# Patient Record
Sex: Female | Born: 2000 | Race: White | Hispanic: Yes | Smoking: Never smoker
Health system: Southern US, Community
[De-identification: ages and names within clinical notes are randomized; demographics above are authoritative.]

## PROBLEM LIST (undated history)

## (undated) ENCOUNTER — Inpatient Hospital Stay (HOSPITAL_COMMUNITY): Payer: Self-pay

## (undated) DIAGNOSIS — K219 Gastro-esophageal reflux disease without esophagitis: Secondary | ICD-10-CM

## (undated) DIAGNOSIS — Z789 Other specified health status: Secondary | ICD-10-CM

## (undated) HISTORY — DX: Other specified health status: Z78.9

## (undated) HISTORY — PX: NO PAST SURGERIES: SHX2092

---
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
## 2022-04-01 ENCOUNTER — Other Ambulatory Visit: Payer: Self-pay

## 2022-04-01 ENCOUNTER — Emergency Department (HOSPITAL_COMMUNITY)
Admission: EM | Admit: 2022-04-01 | Discharge: 2022-04-02 | Disposition: A | Discharge status: Home / Self Care | Payer: Medicaid Other | Attending: Emergency Medicine | Admitting: Emergency Medicine

## 2022-04-01 DIAGNOSIS — R1013 Epigastric pain: Secondary | ICD-10-CM | POA: Diagnosis present

## 2022-04-01 DIAGNOSIS — K2100 Gastro-esophageal reflux disease with esophagitis, without bleeding: Secondary | ICD-10-CM | POA: Insufficient documentation

## 2022-04-01 NOTE — ED Triage Notes (Signed)
Pt c/o upper abdominal pain that she describes as burning. Has been nausea without vomiting, decreased po intake,  burning with urination, and feeling lightheaded.

## 2022-04-02 ENCOUNTER — Emergency Department (HOSPITAL_COMMUNITY): Payer: Medicaid Other

## 2022-04-02 LAB — COMPREHENSIVE METABOLIC PANEL
ALT: 13 U/L (ref 0–44)
AST: 11 U/L — ABNORMAL LOW (ref 15–41)
Albumin: 4.2 g/dL (ref 3.5–5.0)
Alkaline Phosphatase: 74 U/L (ref 38–126)
Anion gap: 9 (ref 5–15)
BUN: 10 mg/dL (ref 6–20)
CO2: 19 mmol/L — ABNORMAL LOW (ref 22–32)
Calcium: 9 mg/dL (ref 8.9–10.3)
Chloride: 109 mmol/L (ref 98–111)
Creatinine, Ser: 0.63 mg/dL (ref 0.44–1.00)
GFR, Estimated: >60 mL/min (ref >60)
Glucose, Bld: 92 mg/dL (ref 70–99)
Potassium: 3.5 mmol/L (ref 3.5–5.1)
Sodium: 137 mmol/L (ref 135–145)
Total Bilirubin: 0.4 mg/dL (ref 0.3–1.2)
Total Protein: 7.6 g/dL (ref 6.5–8.1)

## 2022-04-02 LAB — CBC WITH DIFFERENTIAL/PLATELET
Abs Immature Granulocytes: 0.04 10*3/uL (ref 0.00–0.07)
Basophils Absolute: 0.1 10*3/uL (ref 0.0–0.1)
Basophils Relative: 1 %
Eosinophils Absolute: 0.3 10*3/uL (ref 0.0–0.5)
Eosinophils Relative: 3 %
HCT: 38.4 % (ref 36.0–46.0)
Hemoglobin: 12.9 g/dL (ref 12.0–15.0)
Immature Granulocytes: 0 %
Lymphocytes Relative: 23 %
Lymphs Abs: 2.6 10*3/uL (ref 0.7–4.0)
MCH: 29.2 pg (ref 26.0–34.0)
MCHC: 33.6 g/dL (ref 30.0–36.0)
MCV: 86.9 fL (ref 80.0–100.0)
Monocytes Absolute: 0.7 10*3/uL (ref 0.1–1.0)
Monocytes Relative: 7 %
Neutro Abs: 7.4 10*3/uL (ref 1.7–7.7)
Neutrophils Relative %: 66 %
Platelets: 292 10*3/uL (ref 150–400)
RBC: 4.42 MIL/uL (ref 3.87–5.11)
RDW: 13.2 % (ref 11.5–15.5)
WBC: 11.1 10*3/uL — ABNORMAL HIGH (ref 4.0–10.5)
nRBC: 0 % (ref 0.0–0.2)

## 2022-04-02 LAB — URINALYSIS, ROUTINE W REFLEX MICROSCOPIC
Bacteria, UA: NONE SEEN
Bilirubin Urine: NEGATIVE
Glucose, UA: NEGATIVE mg/dL
Hgb urine dipstick: NEGATIVE
Ketones, ur: 5 mg/dL — AB
Leukocytes,Ua: NEGATIVE
Nitrite: NEGATIVE
Protein, ur: NEGATIVE mg/dL
Specific Gravity, Urine: 1.026 (ref 1.005–1.030)
pH: 6 (ref 5.0–8.0)

## 2022-04-02 LAB — PREGNANCY, URINE: Preg Test, Ur: NEGATIVE

## 2022-04-02 LAB — LIPASE, BLOOD: Lipase: 28 U/L (ref 11–51)

## 2022-04-02 MED ORDER — MAALOX MAX 400-400-40 MG/5ML PO SUSP: 15.0000 mL | Freq: Four times a day (QID) | ORAL | 0 refills | Status: DC | PRN

## 2022-04-02 MED ORDER — OMEPRAZOLE 20 MG PO CPDR: 20.0000 mg | DELAYED_RELEASE_CAPSULE | Freq: Every day | ORAL | 0 refills | Status: DC

## 2022-04-02 MED ORDER — FAMOTIDINE 20 MG PO TABS: 20.0000 mg | ORAL_TABLET | Freq: Two times a day (BID) | ORAL | 0 refills | Status: DC

## 2022-04-02 MED ORDER — ONDANSETRON 4 MG PO TBDP
4.0000 mg | ORAL_TABLET | Freq: Once | ORAL | Status: AC
  Administered 2022-04-02: 4 mg via ORAL
  Filled 2022-04-02: qty 1

## 2022-04-02 NOTE — ED Provider Triage Note (Signed)
Emergency Medicine Provider Triage Evaluation Note  Jodi Mckenzie , a 21 y.o. female  was evaluated in triage.  Pt complains of epigastric pain going on for last couple days, was started on Flagyl due to BV, pain is worsened after p.o. intake, states she feels nausea without active vomiting had normal bowel movements no abdominal surgery no chest pain no shortness of breath ever had this in the past..  Review of Systems  Positive: Epigastric pain, nausea Negative: Chest pain shortness of breath  Physical Exam  BP 126/88 (BP Location: Right Arm)   Pulse 92   Temp 99.4 F (37.4 C)   Resp 18   Ht '5\' 5"'$  (1.651 m)   Wt 60.3 kg   SpO2 100%   BMI 22.13 kg/m  Gen:   Awake, no distress   Resp:  Normal effort  MSK:   Moves extremities without difficulty  Other:    Medical Decision Making  Medically screening exam initiated at 12:00 AM.  Appropriate orders placed.  Jodi Mckenzie was informed that the remainder of the evaluation will be completed by another provider, this initial triage assessment does not replace that evaluation, and the importance of remaining in the ED until their evaluation is complete.  Lab work imaging ordered will need further work-up.   Marcello Fennel, PA-C 04/02/22 0001

## 2022-04-02 NOTE — ED Provider Notes (Signed)
[facility] EMERGENCY DEPARTMENT Provider Note   CSN: [id number] Arrival date & time: 04/01/22  2254     History Chief Complaint  Patient presents with   Abdominal Pain    HPI Jodi Mckenzie is a 21 y.o. female presenting for epigastric abdominal pain.  She endorses 5 days of progressive epigastric burning with developing nausea and poor p.o. intake yesterday.  She presented last night to the emergency department and was unfortunately held for 10 hours in the waiting room.  She denies fevers or chills, syncope or shortness of breath.  She is able to tolerate p.o. intake overnight.  Family history of severe esophagitis no personal history of medical problems.  She states that during her prolonged wait in the emergency department, most of her symptoms have now resolved.   Patient's recorded medical, surgical, social, medication list and allergies were reviewed in the Snapshot window as part of the initial history.   Review of Systems   Review of Systems  Constitutional:  Negative for chills and fever.  HENT:  Negative for ear pain and sore throat.   Eyes:  Negative for pain and visual disturbance.  Respiratory:  Negative for cough and shortness of breath.   Cardiovascular:  Negative for chest pain and palpitations.  Gastrointestinal:  Positive for abdominal pain. Negative for vomiting.  Genitourinary:  Negative for dysuria and hematuria.  Musculoskeletal:  Negative for arthralgias and back pain.  Skin:  Negative for color change and rash.  Neurological:  Negative for seizures and syncope.  All other systems reviewed and are negative.   Physical Exam Updated Vital Signs BP 128/80 (BP Location: Left Arm)   Pulse 98   Temp 99.6 F (37.6 C)   Resp 16   Ht '5\' 5"'$  (1.651 m)   Wt 60.3 kg   SpO2 97%   BMI 22.13 kg/m  Physical Exam Vitals and nursing note reviewed.  Constitutional:      General: She is not in acute distress.    Appearance: She is  well-developed.  HENT:     Head: Normocephalic and atraumatic.  Eyes:     Conjunctiva/sclera: Conjunctivae normal.  Cardiovascular:     Rate and Rhythm: Normal rate and regular rhythm.     Heart sounds: No murmur heard. Pulmonary:     Effort: Pulmonary effort is normal. No respiratory distress.     Breath sounds: Normal breath sounds.  Abdominal:     Palpations: Abdomen is soft.     Tenderness: There is abdominal tenderness in the epigastric area. There is no right CVA tenderness or left CVA tenderness.  Musculoskeletal:        General: No swelling.     Cervical back: Neck supple.  Skin:    General: Skin is warm and dry.     Capillary Refill: Capillary refill takes less than 2 seconds.  Neurological:     Mental Status: She is alert.  Psychiatric:        Mood and Affect: Mood normal.      ED Course/ Medical Decision Making/ A&P    Procedures Procedures   Medications Ordered in ED Medications  ondansetron (ZOFRAN-ODT) disintegrating tablet 4 mg (4 mg Oral Given 04/02/22 0800)    Medical Decision Making:    Syretta Kochel is a 21 y.o. female who presented to the ED today with epigastric abdominal pain detailed above.     Patient placed on continuous vitals and telemetry monitoring while in ED which was reviewed periodically.  Complete initial physical exam performed, notably the patient  was hemodynamically stable in no acute distress.  Patient ambulatory tolerating p.o. intake..      Reviewed and confirmed nursing documentation for past medical history, family history, social history.    Initial Assessment:   With the patient's presentation of abdominal pain, most likely diagnosis is GERD. Other diagnoses were considered including (but not limited to) gastroenteritis, colitis, small bowel obstruction, appendicitis, cholecystitis, pancreatitis, nephrolithiasis, UTI, pyleonephritis, ruptured ectopic pregnancy, PID, ovarian torsion. These are considered less likely due  to history of present illness and physical exam findings.   This is most consistent with an acute life/limb threatening illness complicated by underlying chronic conditions.   Initial Plan:  CBC/CMP to evaluate for underlying infectious/metabolic etiology for patient's abdominal pain  Lipase to evaluate for pancreatitis  RUQ [place] to evaluate for structural/surgical etiology of patients'  abdominal pain.  Urinalysis and repeat physical assessment to evaluate for UTI/Pyelonpehritis  Empiric management of symptoms with escalating pain control and antiemetics as needed.   Initial Study Results:   Laboratory  All laboratory results reviewed without evidence of clinically relevant pathology.    Radiology All images reviewed independently. Agree with radiology report at this time.   US Abdomen Limited  Result Date: 04/02/2022 CLINICAL DATA:  Right upper quadrant abdominal pain EXAM: ULTRASOUND ABDOMEN LIMITED RIGHT UPPER QUADRANT COMPARISON:  None Available. FINDINGS: Gallbladder: No gallstones or wall thickening visualized. No sonographic Murphy sign noted by sonographer. Common bile duct: Diameter: 2 mm in proximal Diane Liver: No focal lesion identified. Within normal limits in parenchymal echogenicity. Portal vein is patent on color Doppler imaging with normal direction of blood flow towards the liver. Other: None. IMPRESSION: 1. Normal right upper quadrant sonogram. Electronically Signed   By: Fidela Salisbury M.D.   On: 04/02/2022 00:45     Final Reassessment and Plan:   Patient observed in the emergency department for a total of 10 hours.  Called back from triage overnight for reassessment this morning.  Fortunately her symptoms have grossly improved.  She has some nonspecific abnormalities including a slight white blood cell count elevation and borderline fever.  She attributes this to being treated for bacterial vaginosis actively at this time on Flagyl.  Her exam is grossly benign fortunately.   Objective findings are reassuring otherwise.  Given her hemodynamic stability, well appearance, protracted observation in the emergency department, I do not believe there is any acute indication for any further intervention with his reported.  Patient's history present on his physical exam findings are most consistent with developing complex esophagitis and GERD.  Given her description of symptoms last night, will start patient on omeprazole 20 mg daily, Pepcid 20 mg as needed, Maalox as needed.  Strict return precaution reinforced importance of following with PCP reinforced the patient expressed understanding. Disposition:  I have considered need for hospitalization, however, considering all of the above, I believe this patient is stable for discharge at this time.  Patient/family educated about specific return precautions for given chief complaint and symptoms.  Patient/family educated about follow-up with PCP.     Patient/family expressed understanding of return precautions and need for follow-up. Patient spoken to regarding all imaging and laboratory results and appropriate follow up for these results. All education provided in verbal form with additional information in written form. Time was allowed for answering of patient questions. Patient discharged.    Emergency Department Medication Summary:   Medications  ondansetron (ZOFRAN-ODT) disintegrating tablet 4 mg (4  mg Oral Given 04/02/22 0800)         Clinical Impression:  1. Gastroesophageal reflux disease with esophagitis without hemorrhage      Discharge   Final Clinical Impression(s) / ED Diagnoses Final diagnoses:  Gastroesophageal reflux disease with esophagitis without hemorrhage    Rx / DC Orders ED Discharge Orders          Ordered    omeprazole (PRILOSEC) 20 MG capsule  Daily        04/02/22 0852    alum & mag hydroxide-simeth (MAALOX MAX) 400-400-40 MG/5ML suspension  Every 6 hours PRN        04/02/22 0852     famotidine (PEPCID) 20 MG tablet  2 times daily        04/02/22 3790              Tretha Sciara, MD 04/02/22 [phone]

## 2022-05-18 NOTE — L&D Delivery Note (Signed)
Delivery Note: Bali Viele [id number] at 100w0d  Admitting diagnosis: Normal labor [O80, Z37.9]   I was called urgently to the room due to the desire to push and was informed at that time the patient was in the tub.  CNM Gerrit Heck has discussed with the RN that if normal BP then it was OK to get into the tub but documentation states the CNM would return for consent.  The patient was in the tub when I arrive and was pushing. There was not sufficient water in the tub for the patient's desired position (hands and knees). She was in a reclined position as this did allow submersion of the vaginal opening.  She was pushing and infant was crowning. I provided verbal instructions and support. Patient pushed once for delivery of the head. There was difficulty due to the position and there was a nuchal present. By this time the tub was filled enough to allow her to transition to kneeling/hands and knees and shoulder delivered in this position. The infant was kept under the water level the entire time.  It only came to the surface to be placed on the mother's chest.   Second Stage: Complete dilation at 04/29/2023 12:45 Supported by FOB- [place] and Doula Katie FHR monitored intermittently and was appropriate throughout pushing.  Pushing in variable positions with waterbirth provider and LD support staff present for birth and supportive. Nuchal Cord: Yes - delivered through and somersaulted under the water  Delivery of a Live born female  Birth Weight:   APGAR:4,8,   Newborn Delivery   Birth date/time: 04/29/2023 13:01:00 Delivery type:     Infant delivered in cephalic presentation, in OA position and shoulders delivered easily. Infant was grasped by  waterbirth provider Federico Flake).  Sommeraulted under the water to alleviate the nuchal cord. Placed on birth parent chest. No birth table was set up. The infant was stimulated and had low initial low APGAR and no instruments were  set up to detach infant. I used bulb suction and provided active feedback to the RN team about the infant's HR and tone.   When instruments were available, cord was double clamped by Federico Flake, MD MD, cut by [facility] of cord blood for typing completed. Cord blood donation-None Arterial cord blood sample-No   Third Stage: Patient stood out of the water and with gentle traction placenta delivered into awaiting specimen tub. Placenta delivered Spontaneous with 3 vessels. Uterine tone firm, bleeding Minimal Uterotonics: Not indicated Placenta to Home with family in cooler they provided  2nd degree laceration identified.  Episiotomy:None Local analgesia: Lidocaine  Repair: 2nd Est. Blood Loss (mL):22.00  Complications: None   Mom to postpartum.  Baby Alex to [facility] care / Skin to Skin.  Delivery Report:   Review the Delivery Report for details.     Signed: Federico Flake, MD  04/29/2023, 5:10 PM

## 2022-05-23 ENCOUNTER — Encounter (HOSPITAL_COMMUNITY): Payer: Self-pay

## 2022-05-23 ENCOUNTER — Ambulatory Visit (HOSPITAL_COMMUNITY)
Admission: EM | Admit: 2022-05-23 | Discharge: 2022-05-23 | Disposition: A | Discharge status: Home / Self Care | Payer: Medicaid Other | Attending: Emergency Medicine | Admitting: Emergency Medicine

## 2022-05-23 DIAGNOSIS — J069 Acute upper respiratory infection, unspecified: Secondary | ICD-10-CM

## 2022-05-23 DIAGNOSIS — H109 Unspecified conjunctivitis: Secondary | ICD-10-CM | POA: Diagnosis not present

## 2022-05-23 LAB — POCT RAPID STREP A, ED / UC: Streptococcus, Group A Screen (Direct): NEGATIVE

## 2022-05-23 MED ORDER — POLYMYXIN B-TRIMETHOPRIM 10000-0.1 UNIT/ML-% OP SOLN: 1.0000 [drp] | OPHTHALMIC | 0 refills | Status: DC

## 2022-05-23 MED ORDER — LIDOCAINE VISCOUS HCL 2 % MT SOLN: 15.0000 mL | OROMUCOSAL | 0 refills | Status: DC | PRN

## 2022-05-23 NOTE — Discharge Instructions (Addendum)
Today you being treated for bacterial conjunctivitis.   Place one drop of polytrim into the effected eye every 4 hours while awake for 7 days. If the other eye starts to have symptoms you may use medication in it as well. Do not allow tip of dropper to touch eye.  May use cool compress for comfort and to remove discharge if present. Pat the eye, do not wipe.  Do not rub eyes, this may cause more irritation.  May use benadryl as needed to help if itching present.  Please avoid use of eye makeup until symptoms clear.  If symptoms persist after use of medication, please follow up at Urgent Care or with ophthalmologist (eye doctor)   Strep negative   May gargle and spit lidocaine solution every 4 hours to provide a temporary relief to your throat  You can take Tylenol and/or Ibuprofen as needed for fever reduction and pain relief.   For cough: honey 1/2 to 1 teaspoon (you can dilute the honey in water or another fluid).  You can also use guaifenesin and dextromethorphan for cough. You can use a humidifier for chest congestion and cough.  If you don't have a humidifier, you can sit in the bathroom with the hot shower running.      For sore throat: try warm salt water gargles, cepacol lozenges, throat spray, warm tea or water with lemon/honey, popsicles or ice, or OTC cold relief medicine for throat discomfort.   For congestion: take a daily anti-histamine like Zyrtec, Claritin, and a oral decongestant, such as pseudoephedrine.  You can also use Flonase 1-2 sprays in each nostril daily.   It is important to stay hydrated: drink plenty of fluids (water, gatorade/powerade/pedialyte, juices, or teas) to keep your throat moisturized and help further relieve irritation/discomfort.

## 2022-05-23 NOTE — ED Triage Notes (Signed)
Pt reports one week of sore throat with cough. Pt reports she has pink eye.
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
## 2022-09-08 ENCOUNTER — Ambulatory Visit (INDEPENDENT_AMBULATORY_CARE_PROVIDER_SITE_OTHER): Payer: Medicaid Other | Admitting: *Deleted

## 2022-09-08 VITALS — BP 108/74 | HR 76 | Ht 65.0 in | Wt 120.3 lb

## 2022-09-08 DIAGNOSIS — Z3481 Encounter for supervision of other normal pregnancy, first trimester: Secondary | ICD-10-CM

## 2022-09-08 DIAGNOSIS — Z3201 Encounter for pregnancy test, result positive: Secondary | ICD-10-CM

## 2022-09-08 LAB — POCT URINE PREGNANCY: Preg Test, Ur: POSITIVE — AB

## 2022-09-08 MED ORDER — VITAFOL ULTRA 29-0.6-0.4-200 MG PO CAPS: 1.0000 | ORAL_CAPSULE | Freq: Every day | ORAL | 12 refills | Status: DC

## 2022-09-08 NOTE — Progress Notes (Signed)
Ms. Prospero presents today for UPT. She has no unusual complaints. LMP:    OBJECTIVE: Appears well, in no apparent distress.  OB History     Gravida  1   Para  1   Term  1   Preterm      AB      Living  1      SAB      IAB      Ectopic      Multiple  0   Live Births  1          Home UPT Result: Positive In-Office UPT result: Positive I have reviewed the patient's medical, obstetrical, social, and family histories, and medications.   ASSESSMENT: Positive pregnancy test  PLAN Prenatal care to be completed at: [facility]  RX PNV sent. Advised on Unisom/ Vit B6 recommendations for nausea, denies current N/V.SAB precautions given. OB Intake/ [place] appt scheduled at check out.

## 2022-09-14 ENCOUNTER — Inpatient Hospital Stay (HOSPITAL_COMMUNITY)
Admission: AD | Admit: 2022-09-14 | Discharge: 2022-09-14 | Disposition: A | Discharge status: Home / Self Care | Payer: Medicaid Other | Attending: Family Medicine | Admitting: Family Medicine

## 2022-09-14 ENCOUNTER — Inpatient Hospital Stay (HOSPITAL_COMMUNITY): Payer: Medicaid Other

## 2022-09-14 ENCOUNTER — Encounter (HOSPITAL_COMMUNITY): Payer: Self-pay | Admitting: Internal Medicine

## 2022-09-14 DIAGNOSIS — Z3A01 Less than 8 weeks gestation of pregnancy: Secondary | ICD-10-CM | POA: Insufficient documentation

## 2022-09-14 DIAGNOSIS — O98811 Other maternal infectious and parasitic diseases complicating pregnancy, first trimester: Secondary | ICD-10-CM | POA: Insufficient documentation

## 2022-09-14 DIAGNOSIS — B3749 Other urogenital candidiasis: Secondary | ICD-10-CM | POA: Diagnosis not present

## 2022-09-14 DIAGNOSIS — O4690 Antepartum hemorrhage, unspecified, unspecified trimester: Secondary | ICD-10-CM | POA: Diagnosis not present

## 2022-09-14 DIAGNOSIS — O4691 Antepartum hemorrhage, unspecified, first trimester: Secondary | ICD-10-CM | POA: Diagnosis present

## 2022-09-14 DIAGNOSIS — B3731 Acute candidiasis of vulva and vagina: Secondary | ICD-10-CM

## 2022-09-14 HISTORY — DX: Gastro-esophageal reflux disease without esophagitis: K21.9

## 2022-09-14 LAB — CBC
HCT: 40 % (ref 36.0–46.0)
Hemoglobin: 12.9 g/dL (ref 12.0–15.0)
MCH: 28.9 pg (ref 26.0–34.0)
MCHC: 32.3 g/dL (ref 30.0–36.0)
MCV: 89.5 fL (ref 80.0–100.0)
Platelets: 289 10*3/uL (ref 150–400)
RBC: 4.47 MIL/uL (ref 3.87–5.11)
RDW: 12.4 % (ref 11.5–15.5)
WBC: 9.3 10*3/uL (ref 4.0–10.5)
nRBC: 0 % (ref 0.0–0.2)

## 2022-09-14 LAB — COMPREHENSIVE METABOLIC PANEL
ALT: 14 U/L (ref 0–44)
AST: 12 U/L — ABNORMAL LOW (ref 15–41)
Albumin: 4.2 g/dL (ref 3.5–5.0)
Alkaline Phosphatase: 71 U/L (ref 38–126)
Anion gap: 7 (ref 5–15)
BUN: 9 mg/dL (ref 6–20)
CO2: 22 mmol/L (ref 22–32)
Calcium: 9.3 mg/dL (ref 8.9–10.3)
Chloride: 104 mmol/L (ref 98–111)
Creatinine, Ser: 0.63 mg/dL (ref 0.44–1.00)
GFR, Estimated: >60 mL/min (ref >60)
Glucose, Bld: 94 mg/dL (ref 70–99)
Potassium: 3.7 mmol/L (ref 3.5–5.1)
Sodium: 133 mmol/L — ABNORMAL LOW (ref 135–145)
Total Bilirubin: 0.4 mg/dL (ref 0.3–1.2)
Total Protein: 7.1 g/dL (ref 6.5–8.1)

## 2022-09-14 LAB — WET PREP, GENITAL
Clue Cells Wet Prep HPF POC: NONE SEEN
Sperm: NONE SEEN
Trich, Wet Prep: NONE SEEN
WBC, Wet Prep HPF POC: >=10 — AB (ref <10)

## 2022-09-14 LAB — HCG, QUANTITATIVE, PREGNANCY: hCG, Beta Chain, Quant, S: 11350 m[IU]/mL — ABNORMAL HIGH (ref <5)

## 2022-09-14 MED ORDER — TERCONAZOLE 0.4 % VA CREA: 1.0000 | TOPICAL_CREAM | Freq: Every day | VAGINAL | 0 refills | Status: DC

## 2022-09-14 NOTE — MAU Note (Signed)
Koralyn Prestage is a 22 y.o. at [redacted]w[redacted]d here in MAU reporting: an hour ago, felt like there was d/c coming out.  ago, went to the bathroom and there was a good bit of blood in her underwear.  Felt the urge for BM, went to the bathroom- cramping in lower abd, nothing was coming out- but when she wiped there was blood on the tissue, freaked her out and she came in.  (Pt brought tissue with her, bright red, but scant no clots). Has random sharp pain at umbilicus- started at same time, no longer feeling the cramping.   Onset of complaint: ~1800 Pain score: 6 Vitals:   09/14/22 1844  BP: 124/81  Pulse: 93  Resp: 16  Temp: 98.2 F (36.8 C)  SpO2: 100%      Lab orders placed from triage:   Blood work has been drawn

## 2022-09-14 NOTE — MAU Provider Note (Signed)
[facility]     CSN: [id number]  Arrival date and time: 09/14/22 8295   Event Date/Time   First Provider Initiated Contact with Patient 09/14/22 2041      Chief Complaint  Patient presents with   Vaginal Bleeding   Abdominal Pain   Jodi Mckenzie is a 22 y.o. G2P1001 at [redacted]w[redacted]d who receives care at [facility].  She presents today for vaginal bleeding.  She states she noted a "pretty good amount" of blood in her underwear today around 1630. She states it was red and only on the tissue, but was a large amount.  She denies clots or blood in the toilet.  She reports she had some cramping prior to this and continues to have intermittent cramping.  She rates the pain a 6/10.  She endorses vaginal discharge that was thick and yellow prior to the bleeding.  She denies recent sexual activity.    OB History     Gravida  2   Para  1   Term  1   Preterm      AB      Living  1      SAB      IAB      Ectopic      Multiple  0   Live Births  1           Past Medical History:  Diagnosis Date   GERD (gastroesophageal reflux disease)     Past Surgical History:  Procedure Laterality Date   NO PAST SURGERIES      Family History  Problem Relation Age of Onset   Thyroid disease Mother    Diabetes Father     Social History   Tobacco Use   Smoking status: Never   Smokeless tobacco: Never  Vaping Use   Vaping Use: Never used  Substance Use Topics   Alcohol use: Never   Drug use: Never    Allergies:  Allergies  Allergen Reactions   Latex Itching and Hives    Medications Prior to Admission  Medication Sig Dispense Refill Last Dose   alum & mag hydroxide-simeth (MAALOX MAX) 400-400-40 MG/5ML suspension Take 15 mLs by mouth every 6 (six) hours as needed for indigestion. 355 mL 0 Past Week   Prenat-Fe Poly-Methfol-FA-DHA (VITAFOL ULTRA) 29-0.6-0.4-200 MG CAPS Take 1 capsule by mouth daily. 30 capsule 12 09/13/2022   famotidine (PEPCID) 20 MG tablet Take 1 tablet (20  mg total) by mouth 2 (two) times daily. 30 tablet 0    lidocaine (XYLOCAINE) 2 % solution Use as directed 15 mLs in the mouth or throat every 4 (four) hours as needed for mouth pain. 100 mL 0    omeprazole (PRILOSEC) 20 MG capsule Take 1 capsule (20 mg total) by mouth daily. 14 capsule 0    prenatal vitamin w/FE, FA (PRENATAL 1 + 1) 27-1 MG TABS tablet Take 1 tablet by mouth daily at 12 noon.      trimethoprim-polymyxin b (POLYTRIM) ophthalmic solution Place 1 drop into the left eye every 4 (four) hours. 10 mL 0     Review of Systems  Gastrointestinal:  Positive for abdominal pain (Cramping-Intermittent). Negative for constipation, diarrhea, nausea and vomiting.  Genitourinary:  Negative for difficulty urinating, dysuria, vaginal bleeding and vaginal discharge.   Physical Exam   Blood pressure 124/81, pulse 93, temperature 98.2 F (36.8 C), temperature source Oral, resp. rate 16, height 5\' 5"  (1.651 m), weight 54.3 kg, last menstrual period 07/28/2022, SpO2 100 %, currently  breastfeeding.  Physical Exam Vitals reviewed.  Constitutional:      Appearance: She is well-developed.  HENT:     Head: Normocephalic and atraumatic.  Eyes:     Conjunctiva/sclera: Conjunctivae normal.  Cardiovascular:     Rate and Rhythm: Normal rate.  Pulmonary:     Effort: Pulmonary effort is normal. No respiratory distress.  Musculoskeletal:        General: Normal range of motion.     Cervical back: Normal range of motion.  Skin:    General: Skin is warm and dry.  Neurological:     Mental Status: She is alert and oriented to person, place, and time.  Psychiatric:        Mood and Affect: Mood normal.        Behavior: Behavior normal.     MAU Course  Procedures Results for orders placed or performed during the hospital encounter of 09/14/22 (from the past 24 hour(s))  hCG, quantitative, pregnancy     Status: Abnormal   Collection Time: 09/14/22  6:39 PM  Result Value Ref Range   hCG, Beta Chain,  Quant, S 11,350 (H) <5 mIU/mL  CBC     Status: None   Collection Time: 09/14/22  6:39 PM  Result Value Ref Range   WBC 9.3 4.0 - 10.5 K/uL   RBC 4.47 3.87 - 5.11 MIL/uL   Hemoglobin 12.9 12.0 - 15.0 g/dL   HCT 16.1 09.6 - 04.5 %   MCV 89.5 80.0 - 100.0 fL   MCH 28.9 26.0 - 34.0 pg   MCHC 32.3 30.0 - 36.0 g/dL   RDW 40.9 81.1 - 91.4 %   Platelets 289 150 - 400 K/uL   nRBC 0.0 0.0 - 0.2 %  Comprehensive metabolic panel     Status: Abnormal   Collection Time: 09/14/22  6:39 PM  Result Value Ref Range   Sodium 133 (L) 135 - 145 mmol/L   Potassium 3.7 3.5 - 5.1 mmol/L   Chloride 104 98 - 111 mmol/L   CO2 22 22 - 32 mmol/L   Glucose, Bld 94 70 - 99 mg/dL   BUN 9 6 - 20 mg/dL   Creatinine, Ser 7.82 0.44 - 1.00 mg/dL   Calcium 9.3 8.9 - 95.6 mg/dL   Total Protein 7.1 6.5 - 8.1 g/dL   Albumin 4.2 3.5 - 5.0 g/dL   AST 12 (L) 15 - 41 U/L   ALT 14 0 - 44 U/L   Alkaline Phosphatase 71 38 - 126 U/L   Total Bilirubin 0.4 0.3 - 1.2 mg/dL   GFR, Estimated >21 >30 mL/min   Anion gap 7 5 - 15  Wet prep, genital     Status: Abnormal   Collection Time: 09/14/22  6:51 PM   Specimen: Vaginal  Result Value Ref Range   Yeast Wet Prep HPF POC PRESENT (A) NONE SEEN   Trich, Wet Prep NONE SEEN NONE SEEN   Clue Cells Wet Prep HPF POC NONE SEEN NONE SEEN   WBC, Wet Prep HPF POC >=10 (A) <10   Sperm NONE SEEN    US OB LESS THAN 14 WEEKS WITH OB TRANSVAGINAL  Result Date: 09/14/2022 CLINICAL DATA:  Vaginal bleeding in pregnancy. EXAM: OBSTETRIC <14 WK [place] AND TRANSVAGINAL OB US TECHNIQUE: Both transabdominal and transvaginal ultrasound examinations were performed for complete evaluation of the gestation as well as the maternal uterus, adnexal regions, and pelvic cul-de-sac. Transvaginal technique was performed to assess early pregnancy. COMPARISON:  01/20/2021 FINDINGS:  Intrauterine gestational sac: Single Yolk sac:  Visualized. Embryo:  Not Visualized. Cardiac Activity: Not Visualized. MSD: 12.2 mm   6  w   0  d Subchorionic hemorrhage:  None visualized. Maternal uterus/adnexae: Uterus is retroflexed. Normal appearance of the ovaries. IMPRESSION: Evidence for intrauterine pregnancy. Calculated gestational age is 6 weeks and 0 days. Electronically Signed   By: Richarda Overlie M.D.   On: 09/14/2022 19:52     MDM PE Self Swab: Wet Prep and GC/CT Labs: UA, UPT, CBC, hCG, CMP Ultrasound Prescription Assessment and Plan  22 year old G2P1001 at 6.6 weeks Vaginal Bleeding Yeast Infection  -Reviewed POC with patient. -Exam performed.  -Patient offered and declines pain medication. -Results as above and reviewed by provider. -Patient questions if bleeding from yeast and informed that highly likely d/t cervical sensitivity during pregnancy and irritation caused by yeast.  -Rx for Terazol sent to pharmacy on file.  -Discussed need for f/u US in 2 weeks to verify viability. -Patient with appt on 5/15.  Message sent to office and order placed for [place]. -Precautions given. -Encouraged to call primary office or return to MAU if symptoms worsen or with the onset of new symptoms. -Discharged to home in stable condition.   Cherre Robins 09/14/2022, 8:41 PM

## 2022-09-15 LAB — GC/CHLAMYDIA PROBE AMP (~~LOC~~) NOT AT ARMC
Chlamydia: NEGATIVE
Comment: NEGATIVE
Comment: NORMAL
Neisseria Gonorrhea: NEGATIVE

## 2022-09-29 ENCOUNTER — Ambulatory Visit (HOSPITAL_COMMUNITY): Admission: RE | Admit: 2022-09-29 | Payer: Medicaid Other | Source: Ambulatory Visit

## 2022-09-30 ENCOUNTER — Other Ambulatory Visit (INDEPENDENT_AMBULATORY_CARE_PROVIDER_SITE_OTHER): Payer: Medicaid Other

## 2022-09-30 ENCOUNTER — Ambulatory Visit (INDEPENDENT_AMBULATORY_CARE_PROVIDER_SITE_OTHER): Payer: Medicaid Other

## 2022-09-30 VITALS — BP 133/78 | HR 103 | Wt 122.0 lb

## 2022-09-30 DIAGNOSIS — Z3A01 Less than 8 weeks gestation of pregnancy: Secondary | ICD-10-CM

## 2022-09-30 DIAGNOSIS — Z3481 Encounter for supervision of other normal pregnancy, first trimester: Secondary | ICD-10-CM

## 2022-09-30 DIAGNOSIS — Z3480 Encounter for supervision of other normal pregnancy, unspecified trimester: Secondary | ICD-10-CM | POA: Insufficient documentation

## 2022-09-30 DIAGNOSIS — O3680X0 Pregnancy with inconclusive fetal viability, not applicable or unspecified: Secondary | ICD-10-CM

## 2022-09-30 MED ORDER — GOJJI WEIGHT SCALE MISC: 1.0000 | 0 refills | Status: DC

## 2022-09-30 MED ORDER — BLOOD PRESSURE KIT DEVI
1.0000 | 0 refills | Status: DC
Start: 2022-09-30 — End: 2023-05-27

## 2022-09-30 NOTE — Progress Notes (Signed)
Patient was seen at MAU on 4/29 for vaginal bleeding in pregnancy. She states that she had noticed a small amount of bleeding the day before going to MAU. On 4/29 she states that she was at work and felt like "gush" come from her vagina and really bad cramping. Patient then went to the bathroom and states that her panties were soaked with blood. States that she had the same feeling a second time about an hour later, so she then went to MAU for eval. U/S on 4/29 showed IU GS and YS. No fetal pole seen. Patient states that she continued to have bleeding dark red bleeding following that MAU visit. Bleeding has continued, but is now lighter and pink. States that the amount is still enough to wear a panty liner. Still has some intermittent cramping since then.
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
## 2022-10-01 LAB — CBC/D/PLT+RPR+RH+ABO+RUBIGG...
Antibody Screen: NEGATIVE
Basophils Absolute: 0.1 10*3/uL (ref 0.0–0.2)
Basos: 1 %
EOS (ABSOLUTE): 0.1 10*3/uL (ref 0.0–0.4)
Eos: 1 %
HCV Ab: NONREACTIVE
HIV Screen 4th Generation wRfx: NONREACTIVE
Hematocrit: 44.2 % (ref 34.0–46.6)
Hemoglobin: 14.6 g/dL (ref 11.1–15.9)
Hepatitis B Surface Ag: NEGATIVE
Immature Grans (Abs): 0 10*3/uL (ref 0.0–0.1)
Immature Granulocytes: 0 %
Lymphocytes Absolute: 1.9 10*3/uL (ref 0.7–3.1)
Lymphs: 20 %
MCH: 29 pg (ref 26.6–33.0)
MCHC: 33 g/dL (ref 31.5–35.7)
MCV: 88 fL (ref 79–97)
Monocytes Absolute: 0.7 10*3/uL (ref 0.1–0.9)
Monocytes: 8 %
Neutrophils Absolute: 6.7 10*3/uL (ref 1.4–7.0)
Neutrophils: 70 %
Platelets: 321 10*3/uL (ref 150–450)
RBC: 5.03 x10E6/uL (ref 3.77–5.28)
RDW: 11.8 % (ref 11.7–15.4)
RPR Ser Ql: NONREACTIVE
Rh Factor: POSITIVE
Rubella Antibodies, IGG: 6.1 index (ref >0.99)
WBC: 9.5 10*3/uL (ref 3.4–10.8)

## 2022-10-01 LAB — HCV INTERPRETATION

## 2022-10-02 LAB — URINE CULTURE, OB REFLEX: Organism ID, Bacteria: NO GROWTH

## 2022-10-02 LAB — CULTURE, OB URINE

## 2022-10-22 ENCOUNTER — Ambulatory Visit (INDEPENDENT_AMBULATORY_CARE_PROVIDER_SITE_OTHER): Payer: Medicaid Other | Admitting: Family Medicine

## 2022-10-22 ENCOUNTER — Encounter: Payer: Self-pay | Admitting: Family Medicine

## 2022-10-22 VITALS — BP 124/77 | HR 77 | Wt 120.6 lb

## 2022-10-22 DIAGNOSIS — L299 Pruritus, unspecified: Secondary | ICD-10-CM

## 2022-10-22 DIAGNOSIS — Z3481 Encounter for supervision of other normal pregnancy, first trimester: Secondary | ICD-10-CM

## 2022-10-22 DIAGNOSIS — Z3A11 11 weeks gestation of pregnancy: Secondary | ICD-10-CM | POA: Diagnosis not present

## 2022-10-22 DIAGNOSIS — Z3480 Encounter for supervision of other normal pregnancy, unspecified trimester: Secondary | ICD-10-CM

## 2022-10-22 NOTE — Progress Notes (Signed)
Pt presents for NOB visit. Pt reports severe itching on the chest for 1 week.   Pt has ringworm on abd. Started treatment yesterday.

## 2022-10-22 NOTE — Progress Notes (Signed)
Subjective:   Jodi Mckenzie is a 22 y.o. G2P1001 at [redacted]w[redacted]d by early ultrasound being seen today for her first obstetrical visit.  Her obstetrical history is significant for  None . Patient does intend to breast feed. Pregnancy history fully reviewed.  Patient reports no bleeding, no contractions, no cramping, no leaking, and is having itching with little red bumps on breast .  HISTORY: OB History  Gravida Para Term Preterm AB Living  2 1 1  0 0 1  SAB IAB Ectopic Multiple Live Births  0 0 0 0 1    # Outcome Date GA Lbr Len/2nd Weight Sex Delivery Anes PTL Lv  2 Current           1 Term 06/05/21 [redacted]w[redacted]d  8 lb 7.5 oz (3.841 kg) M Vag-Vacuum None, Local  LIV     Name: Sylvain,BOY Nhung     Apgar1: 6  Apgar5: 9   Last pap smear was  None Past Medical History:  Diagnosis Date   GERD (gastroesophageal reflux disease)    Past Surgical History:  Procedure Laterality Date   NO PAST SURGERIES     Family History  Problem Relation Age of Onset   Thyroid disease Mother    Diabetes Father    Social History   Tobacco Use   Smoking status: Never   Smokeless tobacco: Never  Vaping Use   Vaping Use: Never used  Substance Use Topics   Alcohol use: Never   Drug use: Never   Allergies  Allergen Reactions   Latex Itching and Hives   Current Outpatient Medications on File Prior to Visit  Medication Sig Dispense Refill   Blood Pressure Monitoring (BLOOD PRESSURE KIT) DEVI 1 kit by Does not apply route once a week. 1 each 0   Misc. Devices (GOJJI WEIGHT SCALE) MISC 1 Device by Does not apply route every 30 (thirty) days. 1 each 0   prenatal vitamin w/FE, FA (PRENATAL 1 + 1) 27-1 MG TABS tablet Take 1 tablet by mouth daily at 12 noon.     terconazole (TERAZOL 7) 0.4 % vaginal cream Place 1 applicator vaginally at bedtime. Use for seven days (Patient not taking: Reported on 10/22/2022) 45 g 0   No current facility-administered medications on file prior to visit.     Exam    Vitals:   10/22/22 1016  BP: 124/77  Pulse: 77  Weight: 120 lb 9.6 oz (54.7 kg)   Fetal Heart Rate (bpm): 165  System: General: well-developed, well-nourished female in no acute distress   Breast:  normal appearance, no masses or tenderness   Skin: normal coloration and turgor, rash noted on lateral breast which patient reports comes and goes and itches. Multiple ringworm noted on abdomin    Neurologic: oriented, normal, negative, normal mood   Extremities: normal strength, tone, and muscle mass, ROM of all joints is normal   HEENT PERRLA, extraocular movement intact and sclera clear, anicteric   Mouth/Teeth mucous membranes moist, pharynx normal without lesions and dental hygiene good   Neck supple and no masses   Cardiovascular: regular rate and rhythm   Respiratory:  no respiratory distress, normal breath sounds   Abdomen: soft, non-tender; bowel sounds normal; no masses,  no organomegaly     Assessment:   Pregnancy: G2P1001 Patient Active Problem List   Diagnosis Date Noted   Supervision of other normal pregnancy, antepartum 09/30/2022     Plan:  1. Supervision of other normal pregnancy, antepartum Continue  routine prenatal care Genetic screening ordered today - [place] MFM OB COMP + 14 WK; Future  2. Itching Itching looks like may be eczematous.  Discussed moisturizing lotions and patient will try this first.  If it worsens she will call.  Patient is also being treated for ringworm on abdomen.  Lesions on breast do not look like ringworm.   Initial labs drawn. Continue prenatal vitamins. Genetic Screening discussed, NIPS: ordered. Ultrasound discussed; fetal anatomic survey: ordered. Problem list reviewed and updated. The nature of [facility] with multiple MDs and other Advanced Practice Providers was explained to patient; also emphasized that residents, students are part of our team. Routine obstetric precautions reviewed. No  follow-ups on file.

## 2022-10-29 LAB — PANORAMA PRENATAL TEST FULL PANEL:PANORAMA TEST PLUS 5 ADDITIONAL MICRODELETIONS: FETAL FRACTION: 14.7

## 2022-11-03 LAB — HORIZON CUSTOM: REPORT SUMMARY: POSITIVE — AB

## 2022-11-04 ENCOUNTER — Other Ambulatory Visit: Payer: Self-pay

## 2022-11-04 DIAGNOSIS — Z141 Cystic fibrosis carrier: Secondary | ICD-10-CM

## 2022-11-04 DIAGNOSIS — Z3480 Encounter for supervision of other normal pregnancy, unspecified trimester: Secondary | ICD-10-CM

## 2022-11-06 ENCOUNTER — Telehealth: Payer: Self-pay | Admitting: Family Medicine

## 2022-11-06 DIAGNOSIS — O285 Abnormal chromosomal and genetic finding on antenatal screening of mother: Secondary | ICD-10-CM

## 2022-11-06 NOTE — Telephone Encounter (Signed)
Called patient to discuss results from genetic testing.  She reports that she saw the results.  Discussed with her that I will place referral for genetics and that she will likely need partner testing and patient understands this.  Has no further questions or concerns.

## 2022-11-11 ENCOUNTER — Telehealth: Payer: Self-pay

## 2022-11-11 NOTE — Telephone Encounter (Signed)
Patient called stating that she has had a migraine headache for the last 2 days. She took tylenol last night with relief. BP last night was 122/77. Patient is feeling better now. Patient advised to continue to monitor and if sx return or worsen to be seen for evaluation.

## 2022-11-23 ENCOUNTER — Ambulatory Visit (INDEPENDENT_AMBULATORY_CARE_PROVIDER_SITE_OTHER): Payer: Medicaid Other | Admitting: Advanced Practice Midwife

## 2022-11-23 ENCOUNTER — Encounter: Payer: Self-pay | Admitting: Advanced Practice Midwife

## 2022-11-23 VITALS — BP 110/68 | HR 102 | Wt 122.9 lb

## 2022-11-23 DIAGNOSIS — Z141 Cystic fibrosis carrier: Secondary | ICD-10-CM

## 2022-11-23 DIAGNOSIS — Z3480 Encounter for supervision of other normal pregnancy, unspecified trimester: Secondary | ICD-10-CM

## 2022-11-23 DIAGNOSIS — O99891 Other specified diseases and conditions complicating pregnancy: Secondary | ICD-10-CM

## 2022-11-23 DIAGNOSIS — M549 Dorsalgia, unspecified: Secondary | ICD-10-CM

## 2022-11-23 NOTE — Progress Notes (Signed)
Pt presents for ROB visit. Pt c/o low back pain. No other concerns.

## 2022-11-23 NOTE — Progress Notes (Signed)
   PRENATAL VISIT NOTE  Subjective:  Jodi Mckenzie is a 22 y.o. G2P1001 at [redacted]w[redacted]d being seen today for ongoing prenatal care.  She is currently monitored for the following issues for this low-risk pregnancy and has Supervision of other normal pregnancy, antepartum on their problem list.  Patient reports backache.  Contractions: Not present. Vag. Bleeding: None.  Movement: Present. Denies leaking of fluid.   The following portions of the patient's history were reviewed and updated as appropriate: allergies, current medications, past family history, past medical history, past social history, past surgical history and problem list.   Objective:   Vitals:   11/23/22 0932  BP: 110/68  Pulse: (!) 102  Weight: 122 lb 14.4 oz (55.7 kg)    Fetal Status: Fetal Heart Rate (bpm): 161   Movement: Present     General:  Alert, oriented and cooperative. Patient is in no acute distress.  Skin: Skin is warm and dry. No rash noted.   Cardiovascular: Normal heart rate noted  Respiratory: Normal respiratory effort, no problems with respiration noted  Abdomen: Soft, gravid, appropriate for gestational age.  Pain/Pressure: Present     Pelvic: Cervical exam deferred        Extremities: Normal range of motion.  Edema: None  Mental Status: Normal mood and affect. Normal behavior. Normal judgment and thought content.   Assessment and Plan:  Pregnancy: G2P1001 at [redacted]w[redacted]d 1. Supervision of other normal pregnancy, antepartum --Anticipatory guidance about next visits/weeks of pregnancy given.  - Pt is interested in waterbirth.  No contraindications at this time per chart review/patient assessment.   - Pt to enroll in class, see CNMs for most visits in the office.  - Discussed waterbirth as option for low-risk pregnancy.  Reviewed conditions that may arise during pregnancy that will risk pt out of waterbirth including hypertension, diabetes, fetal growth restriction <10%ile, etc.   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 Kit sent.  3. Back pain affecting pregnancy in second trimester --Pain in low back/hips, worse at night.  Difficult to sleep.  --Rest/ice/heat/warm bath/increase PO fluids/Tylenol/pregnancy support belt after 18-19 weeks - AMB referral to rehabilitation   Preterm labor symptoms and general obstetric precautions including but not limited to vaginal bleeding, contractions, leaking of fluid and fetal movement were reviewed in detail with the patient. Please refer to After Visit Summary for other counseling recommendations.   Return in about 4 weeks (around 12/21/2022) for Midwife preferred, LOB.  Future Appointments  Date Time Provider Department Center  12/02/2022 10:00 AM [facility] GENETIC RM [facility] None  12/17/2022  9:30 AM [facility] NURSE [facility]  12/17/2022  9:45 AM [facility] US4 [facility]  12/21/2022  8:35 AM Leftwich-Kirby, Wilmer Floor, CNM [facility] None    Sharen Counter, CNM

## 2022-11-25 LAB — AFP, SERUM, OPEN SPINA BIFIDA
AFP MoM: 0.9
AFP Value: 30.4 ng/mL
Gest. Age on Collection Date: 15 weeks
Maternal Age At EDD: 20.9 yr
OSBR Risk 1 IN: 10000
Test Results:: NEGATIVE
Weight: 122 [lb_av]

## 2022-11-30 ENCOUNTER — Encounter: Payer: Self-pay | Admitting: Advanced Practice Midwife

## 2022-11-30 ENCOUNTER — Encounter (HOSPITAL_COMMUNITY): Payer: Self-pay | Admitting: Family Medicine

## 2022-11-30 ENCOUNTER — Inpatient Hospital Stay (HOSPITAL_COMMUNITY)
Admission: AD | Admit: 2022-11-30 | Discharge: 2022-11-30 | Disposition: A | Discharge status: Home / Self Care | Payer: Medicaid Other | Attending: Family Medicine | Admitting: Family Medicine

## 2022-11-30 DIAGNOSIS — R000 Tachycardia, unspecified: Secondary | ICD-10-CM | POA: Insufficient documentation

## 2022-11-30 DIAGNOSIS — O99891 Other specified diseases and conditions complicating pregnancy: Secondary | ICD-10-CM | POA: Insufficient documentation

## 2022-11-30 DIAGNOSIS — Z3A16 16 weeks gestation of pregnancy: Secondary | ICD-10-CM | POA: Diagnosis not present

## 2022-11-30 LAB — TSH: TSH: 1.033 u[IU]/mL (ref 0.350–4.500)

## 2022-11-30 LAB — URINALYSIS, ROUTINE W REFLEX MICROSCOPIC
Bilirubin Urine: NEGATIVE
Glucose, UA: NEGATIVE mg/dL
Hgb urine dipstick: NEGATIVE
Ketones, ur: 20 mg/dL — AB
Nitrite: NEGATIVE
Protein, ur: NEGATIVE mg/dL
Specific Gravity, Urine: 1.006 (ref 1.005–1.030)
pH: 6 (ref 5.0–8.0)

## 2022-11-30 LAB — COMPREHENSIVE METABOLIC PANEL
ALT: 17 U/L (ref 0–44)
AST: 14 U/L — ABNORMAL LOW (ref 15–41)
Albumin: 2.8 g/dL — ABNORMAL LOW (ref 3.5–5.0)
Alkaline Phosphatase: 54 U/L (ref 38–126)
Anion gap: 6 (ref 5–15)
BUN: 6 mg/dL (ref 6–20)
CO2: 22 mmol/L (ref 22–32)
Calcium: 8.4 mg/dL — ABNORMAL LOW (ref 8.9–10.3)
Chloride: 106 mmol/L (ref 98–111)
Creatinine, Ser: 0.43 mg/dL — ABNORMAL LOW (ref 0.44–1.00)
GFR, Estimated: >60 mL/min (ref >60)
Glucose, Bld: 75 mg/dL (ref 70–99)
Potassium: 3.5 mmol/L (ref 3.5–5.1)
Sodium: 134 mmol/L — ABNORMAL LOW (ref 135–145)
Total Bilirubin: 0.3 mg/dL (ref 0.3–1.2)
Total Protein: 6.9 g/dL (ref 6.5–8.1)

## 2022-11-30 LAB — CBC WITH DIFFERENTIAL/PLATELET
Abs Immature Granulocytes: 0.03 10*3/uL (ref 0.00–0.07)
Basophils Absolute: 0 10*3/uL (ref 0.0–0.1)
Basophils Relative: 0 %
Eosinophils Absolute: 0.2 10*3/uL (ref 0.0–0.5)
Eosinophils Relative: 2 %
HCT: 35.7 % — ABNORMAL LOW (ref 36.0–46.0)
Hemoglobin: 12 g/dL (ref 12.0–15.0)
Immature Granulocytes: 0 %
Lymphocytes Relative: 22 %
Lymphs Abs: 1.5 10*3/uL (ref 0.7–4.0)
MCH: 29.8 pg (ref 26.0–34.0)
MCHC: 33.6 g/dL (ref 30.0–36.0)
MCV: 88.6 fL (ref 80.0–100.0)
Monocytes Absolute: 0.5 10*3/uL (ref 0.1–1.0)
Monocytes Relative: 7 %
Neutro Abs: 4.6 10*3/uL (ref 1.7–7.7)
Neutrophils Relative %: 69 %
Platelets: 249 10*3/uL (ref 150–400)
RBC: 4.03 MIL/uL (ref 3.87–5.11)
RDW: 12.2 % (ref 11.5–15.5)
WBC: 6.7 10*3/uL (ref 4.0–10.5)
nRBC: 0 % (ref 0.0–0.2)

## 2022-11-30 LAB — MAGNESIUM: Magnesium: 1.9 mg/dL (ref 1.7–2.4)

## 2022-11-30 MED ORDER — LACTATED RINGERS IV BOLUS
1000.0000 mL | Freq: Once | INTRAVENOUS | Status: AC
  Administered 2022-11-30: 1000 mL via INTRAVENOUS

## 2022-11-30 NOTE — MAU Note (Addendum)
Jodi Mckenzie is a 22 y.o. at [redacted]w[redacted]d here in MAU reporting: her pulse rate this past wk has been 112-130, her BP has been perfect.  When it is up she feels it and feels light headed.  Called her her, was told to come in. Has been stressed, mom is in the ICU.   Stayed home yesterday, tried to rest, still felt like it was beating out of her chest.  Had a little HA last night, took Tylenol and it went away.  Denies any bleeding.   Denies hx of anemia.  Is still breast feeding also. Says she is drinking fluilds.(Woke at 10, has had 2 bottles of water). Onset of complaint: past wk Pain score: none Vitals:   11/30/22 1346  BP: 107/69  Pulse: (!) 127  Resp: 15  Temp: 98.1 F (36.7 C)  SpO2: 100%     FHT:166 Lab orders placed from triage:  urine  Pulse in triage on o2sat 116-131

## 2022-11-30 NOTE — MAU Provider Note (Signed)
MAU Provider Note  History  [id number]  Arrival date and time: 11/30/22 1334   Chief Complaint  Patient presents with   Tachycardia     HPI Jodi Mckenzie is a 22 y.o. G2P1001 at [redacted]w[redacted]d by [place] who presents for tachycardia.  Patient says that she has been monitoring her blood pressure at home due to an elevated office reading she has noticed that her heart rate has been above 120 for the last week.  States that the highest when she saw was 137.  Patient notes intermittent episodes of palpitations and lightheadedness.  She will occasionally get sharp pain in her upper back associated with palpitations.  Patient does note increased stress due to her mom being in the ICU and she has also had decreased p.o. intake.  Notes headaches intermittently, most recent one was this morning described as pressure in the back of her head.  States that the headaches improved with Tylenol.  No history of similar symptoms or anxiety.   Vaginal bleeding: No LOF: No Fetal Movement: Yes Contractions: No  O/Positive/-- (05/15 1422)  OB History  Gravida Para Term Preterm AB Living  2 1 1     1   SAB IAB Ectopic Multiple Live Births        0 1    # Outcome Date GA Lbr Len/2nd Weight Sex Type Anes PTL Lv  2 Current           1 Term 06/05/21 [redacted]w[redacted]d  3841 g M Vag-Vacuum None, Local  LIV     Past Medical History:  Diagnosis Date   GERD (gastroesophageal reflux disease)     Past Surgical History:  Procedure Laterality Date   NO PAST SURGERIES      Family History  Problem Relation Age of Onset   Thyroid disease Mother    Diabetes Father     Social History   Socioeconomic History   Marital status: Single    Spouse name: Not on file   Number of children: Not on file   Years of education: Not on file   Highest education level: Not on file  Occupational History   Not on file  Tobacco Use   Smoking status: Never   Smokeless tobacco: Never  Vaping Use   Vaping status: Never Used  Substance and  Sexual Activity   Alcohol use: Never   Drug use: Never   Sexual activity: Yes    Birth control/protection: None  Other Topics Concern   Not on file  Social History Narrative   Not on file   Social Determinants of Health   Financial Resource Strain: Low Risk  (07/07/2022)   Received from [facility], [facility]   Overall Financial Resource Strain (CARDIA)    Difficulty of Paying Living Expenses: Not hard at all  Food Insecurity: No Food Insecurity (07/07/2022)   Received from [facility], [facility]   Hunger Vital Sign    Worried About Running Out of Food in the Last Year: Never true    Ran Out of Food in the Last Year: Never true  Transportation Needs: No Transportation Needs (07/07/2022)   Received from [facility], [facility]   PRAPARE - Transportation    Lack of Transportation (Medical): No    Lack of Transportation (Non-Medical): No  Physical Activity: Not on file  Stress: Not on file  Social Connections: Unknown (09/14/2021)   Received from [facility], [facility]   Social Network    Social Network: Not on file  Intimate Partner Violence: Unknown (08/19/2021)   Received from [facility], [facility]   HITS    Physically Hurt: Not on file    Insult or Talk Down To: Not on file    Threaten Physical Harm: Not on file    Scream or Curse: Not on file    Allergies  Allergen Reactions   Latex Itching and Hives    No current facility-administered medications on file prior to encounter.   Current Outpatient Medications on File Prior to Encounter  Medication Sig Dispense Refill   Blood Pressure Monitoring (BLOOD PRESSURE KIT) DEVI 1 kit by Does not apply route once a week. 1 each 0   Misc. Devices (GOJJI WEIGHT SCALE) MISC 1 Device by Does not apply route every 30 (thirty) days. 1 each 0   prenatal vitamin w/FE, FA (PRENATAL 1 + 1) 27-1 MG TABS tablet Take 1 tablet by mouth daily at 12 noon.     terconazole (TERAZOL 7) 0.4 % vaginal cream Place 1  applicator vaginally at bedtime. Use for seven days (Patient not taking: Reported on 10/22/2022) 45 g 0    ROS: Pertinent positives and negative per HPI  Physical Exam   BP 107/69 (BP Location: Right Arm)   Pulse (!) 127   Temp 98.1 F (36.7 C) (Oral)   Resp 15   Ht 5\' 5"  (1.651 m)   Wt 54.8 kg   LMP 07/28/2022 (Approximate)   SpO2 100%   Breastfeeding Yes   BMI 20.10 kg/m   Patient Vitals for the past 24 hrs:  BP Temp Temp src Pulse Resp SpO2 Height Weight  11/30/22 1346 107/69 98.1 F (36.7 C) Oral (!) 127 15 100 % 5\' 5"  (1.651 m) 54.8 kg    Physical Exam  Cervical Exam    Bedside Ultrasound not done   Labs Results for orders placed or performed during the hospital encounter of 11/30/22 (from the past 24 hour(s))  Urinalysis, Routine w reflex microscopic -Urine, Clean Catch     Status: Abnormal   Collection Time: 11/30/22  1:53 PM  Result Value Ref Range   Color, Urine YELLOW YELLOW   APPearance CLEAR CLEAR   Specific Gravity, Urine 1.006 1.005 - 1.030   pH 6.0 5.0 - 8.0   Glucose, UA NEGATIVE NEGATIVE mg/dL   Hgb urine dipstick NEGATIVE NEGATIVE   Bilirubin Urine NEGATIVE NEGATIVE   Ketones, ur 20 (A) NEGATIVE mg/dL   Protein, ur NEGATIVE NEGATIVE mg/dL   Nitrite NEGATIVE NEGATIVE   Leukocytes,Ua SMALL (A) NEGATIVE   RBC / HPF 0-5 0 - 5 RBC/hpf   WBC, UA 0-5 0 - 5 WBC/hpf   Bacteria, UA RARE (A) NONE SEEN   Squamous Epithelial / HPF 0-5 0 - 5 /HPF  Comprehensive metabolic panel     Status: Abnormal   Collection Time: 11/30/22  2:49 PM  Result Value Ref Range   Sodium 134 (L) 135 - 145 mmol/L   Potassium 3.5 3.5 - 5.1 mmol/L   Chloride 106 98 - 111 mmol/L   CO2 22 22 - 32 mmol/L   Glucose, Bld 75 70 - 99 mg/dL   BUN 6 6 - 20 mg/dL   Creatinine, Ser 1.61 (L) 0.44 - 1.00 mg/dL   Calcium 8.4 (L) 8.9 - 10.3 mg/dL   Total Protein 6.9 6.5 - 8.1 g/dL   Albumin 2.8 (L) 3.5 - 5.0 g/dL   AST 14 (L) 15 - 41 U/L   ALT 17 0 - 44 U/L  Alkaline Phosphatase 54  38 - 126 U/L   Total Bilirubin 0.3 0.3 - 1.2 mg/dL   GFR, Estimated >44 >03 mL/min   Anion gap 6 5 - 15  Magnesium     Status: None   Collection Time: 11/30/22  2:49 PM  Result Value Ref Range   Magnesium 1.9 1.7 - 2.4 mg/dL  CBC with Differential/Platelet     Status: Abnormal   Collection Time: 11/30/22  2:49 PM  Result Value Ref Range   WBC 6.7 4.0 - 10.5 K/uL   RBC 4.03 3.87 - 5.11 MIL/uL   Hemoglobin 12.0 12.0 - 15.0 g/dL   HCT 47.4 (L) 25.9 - 56.3 %   MCV 88.6 80.0 - 100.0 fL   MCH 29.8 26.0 - 34.0 pg   MCHC 33.6 30.0 - 36.0 g/dL   RDW 87.5 64.3 - 32.9 %   Platelets 249 150 - 400 K/uL   nRBC 0.0 0.0 - 0.2 %   Neutrophils Relative % 69 %   Neutro Abs 4.6 1.7 - 7.7 K/uL   Lymphocytes Relative 22 %   Lymphs Abs 1.5 0.7 - 4.0 K/uL   Monocytes Relative 7 %   Monocytes Absolute 0.5 0.1 - 1.0 K/uL   Eosinophils Relative 2 %   Eosinophils Absolute 0.2 0.0 - 0.5 K/uL   Basophils Relative 0 %   Basophils Absolute 0.0 0.0 - 0.1 K/uL   Immature Granulocytes 0 %   Abs Immature Granulocytes 0.03 0.00 - 0.07 K/uL    Imaging No results found.  MAU Course  MDM:   This patient presents to the ED for concern of   Chief Complaint  Patient presents with   Tachycardia     External records from outside source obtained and reviewed including Prenatal care records  Lab Tests: UA, CMP, CBC, and Other Mg, TSH  I ordered, and personally interpreted labs.  The pertinent results include:  CBC, CMP, Mg, WNL    Cardiac Testing/Monitoring:  EKG was ordered today. I personally reviewed or consulted with a physician to help with intrepretation. EKG NSR, rate 81  MAU Course: 1L LR  After the interventions noted above, I reevaluated the patient and found that they have improved  Dispostion: discharged   Assessment and Plan  1. [redacted] weeks gestation of pregnancy  2. Tachycardia   Pt feeling much better after 1L LR. EKG and labs reassuring. Pt advised to push fluids at home and eat  regularly while breastfeeding. Gave return and preterm labor precautions.  Follow-up with primary OB.    Para March, DO FM Resident [facility], Center for [facility] 11/30/22  4:02 PM

## 2022-11-30 NOTE — Discharge Instructions (Signed)
Please continue to hydrate and eat while breastfeeding. Return with new or worsening symptoms, including vaginal bleeding, gush of fluid, not feeling baby move, or contractions

## 2022-12-01 ENCOUNTER — Ambulatory Visit: Payer: Medicaid Other | Attending: Advanced Practice Midwife | Admitting: Physical Therapy

## 2022-12-01 DIAGNOSIS — M5459 Other low back pain: Secondary | ICD-10-CM | POA: Insufficient documentation

## 2022-12-01 DIAGNOSIS — M6281 Muscle weakness (generalized): Secondary | ICD-10-CM | POA: Insufficient documentation

## 2022-12-02 ENCOUNTER — Ambulatory Visit: Payer: Self-pay | Admitting: Obstetrics

## 2022-12-02 ENCOUNTER — Ambulatory Visit (HOSPITAL_BASED_OUTPATIENT_CLINIC_OR_DEPARTMENT_OTHER): Payer: Medicaid Other | Admitting: Obstetrics and Gynecology

## 2022-12-02 DIAGNOSIS — O285 Abnormal chromosomal and genetic finding on antenatal screening of mother: Secondary | ICD-10-CM | POA: Diagnosis not present

## 2022-12-02 DIAGNOSIS — Z141 Cystic fibrosis carrier: Secondary | ICD-10-CM | POA: Diagnosis not present

## 2022-12-02 DIAGNOSIS — Z3A16 16 weeks gestation of pregnancy: Secondary | ICD-10-CM | POA: Diagnosis not present

## 2022-12-02 DIAGNOSIS — O09892 Supervision of other high risk pregnancies, second trimester: Secondary | ICD-10-CM

## 2022-12-02 NOTE — Progress Notes (Signed)
Virtual Visit via Video Note  I connected with Jodi Mckenzie on 12/02/22 at 10:00 AM EDT by a video enabled telemedicine application and verified that I am speaking with the correct person using two identifiers.  Location: Patient: home Provider: [facility] [place]   I discussed the limitations of evaluation and management by telemedicine and the availability of in person appointments. The patient expressed understanding and agreed to proceed.  Referring provider: Dr. Nobie Putnam, [facility] Length of consultation: 40 minutes  Jodi Mckenzie was referred to [facility] for genetic counseling to review the results of recent carrier screening as well as testing options. She was present on this virtual visit alone.  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 Jodi Mckenzie carrier screening was negative for alpha and beta hemoglobinopathies as well as spinal muscular atrophy.  Thus, her risk to be a carrier for these additional conditions (listed separately in the laboratory report) has been reduced but not eliminated. In addition, Panorama testing for chromosome conditions showed a low risk result for Down syndrome, Trisomy 47, Trisomy 46, triploidy, monosomy X and 22q11.2 deletion syndrome.  Maternal serum AFP testing was also low risk for open neural tube defects.  See those reports for details.  CF is a condition characterized by the buildup of thick, sticky mucus that can damage the body's organs. Mucus lubricates and protects the linings of the airways, digestive system, reproductive system, and other organs and tissues. Individuals with CF have abnormally sticky mucus that cannot easily be cleared from the airways and digestive system, leading to progressive damage to the respiratory system and chronic digestive system problems. The most common features of CF include respiratory difficulties, bacterial  infections in the lungs, the formation of scar tissue (fibrosis) and cysts in the lungs, pancreatic insufficiency, CF-related diabetes mellitus, diarrhea, malnutrition, poor growth, and weight loss. Most men with CF have congenital bilateral absence of the vas deferens (CBAVD) which causes female infertility. With therapies, such as daily respiratory therapies and medications to aid digestion, the median lifespan for people with CF is now in their 40's. Treatment may involve lung transplantation and CFTR protein modulators in some cases. CF is variably expressed, meaning features of the condition and their severity vary among affected individuals. Expression and severity of CF depends upon the specific mutations present in an affected individual.   CF is caused by mutations in the CFTR gene. This gene provides instructions for a channel that transports chloride ions into and out of cells. The flow of chloride ions helps control the movement of water in the body's tissues, which is necessary for the production of thin, freely flowing mucus. Pathogenic variants in the CFTR gene disrupt the function of the chloride channels, preventing them from regulating the flow of chloride ions and water across cell membranes. 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  CF is inherited in an autosomal recessive manner. This means that the current fetus is only at risk for CF if Jodi Mckenzie partner is also a carrier for the condition. Based on the carrier frequency for CF in the Hispanic population, Ms. Meditz partner Jodi Mckenzie) has a 1 in 66 chance of being a carrier for CF. Thus, the couple currently has a 1 in 232 (0.5%) chance of having a child with CF. If [name] partner is also identified to be a carrier,  the risk for CF in the pregnancy would be 1 in 4 (25%).   If both members of a couple are known to be carriers, then diagnostic  testing is available during pregnancy to determine if the fetus is affected.  This can be performed via [facility] at 10-[redacted] weeks gestation or amniocentesis after [redacted] weeks gestation.  We discussed the risks, benefits and limitations of these testing options. If diagnostic testing during pregnancy is not desired, CF testing can be ordered on cord blood at the time of delivery and is included in newborn screening in [place].  The newborn screening testing utilizes trypsinogen levels rather than genetic testing to detect affected individuals whereas genetic testing can determine the specific variants, if any, that a child may have inherited.  We discussed that carrier testing for CF is recommended for Jodi Mckenzie's partner. Jodi Mckenzie indicated that they already have received a saliva test kit through their OB and may send in the partner carrier screening soon. She also understands that newborn screening performed for all infants in [place] assesses for CF after birth.  We also obtained a detailed family history and pregnancy history. This is the second pregnancy for this couple.  They also have a healthy 68 month old son.  Jodi Mckenzie reported no complications or exposures in this pregnancy that would be expected to increase the risk for birth defects.  Jodi Mckenzie has a healthy 33 year old daughter.  He also has one brother with autism. Autism Spectrum Disorder affects approximately 1-2% of the general population in the [place], [place], and [place]. Autism is a neurological and developmental disorder that affects how people interact with others, communicate, learn, and behave. Autism is known as a "spectrum" disorder because there is wide variation in the type and severity of symptoms people experience. Genetic testing for individuals with a clinical diagnosis of autism yields an explanation in only about 20% of cases, and the remaining 80% of cases are left with unknown etiology. In the absence of a known genetic cause  in the affected family member, we are unable to test directly for autism in pregnancy. We did review the option of carrier testing for Fragile X syndrome, the most common inherited cause for intellectual delays which can have autism as a features, but given this relative is through the father of the baby and this pregnancy is predicted to be female, he would have inherited the Y chromosome from his father and would not be at risk for an x linked condition from that side of the family.  The remainder of the family history was unremarkable for birth defects, intellectual delays, recurrent pregnancy loss or known genetic conditions.    Plan of care: Loria to speak with Jodi Mckenzie and send in saliva kit if desired.  We encouraged the patient to reach out once those results are available (as we did not order the testing and will not receive the results). If they decide not to test Jodi Mckenzie at this time, then follow up on newborn screening or testing for CF after delivery would be appropriate.   We may be reached at [phone] with any questions or concerns.   I provided 40 minutes of non-face-to-face time during this encounter.   Katrina Stack

## 2022-12-15 ENCOUNTER — Ambulatory Visit: Payer: Medicaid Other

## 2022-12-15 ENCOUNTER — Other Ambulatory Visit: Payer: Self-pay

## 2022-12-15 DIAGNOSIS — M5459 Other low back pain: Secondary | ICD-10-CM | POA: Diagnosis present

## 2022-12-15 DIAGNOSIS — M6281 Muscle weakness (generalized): Secondary | ICD-10-CM

## 2022-12-15 DIAGNOSIS — O09899 Supervision of other high risk pregnancies, unspecified trimester: Secondary | ICD-10-CM

## 2022-12-15 HISTORY — DX: Supervision of other high risk pregnancies, unspecified trimester: O09.899

## 2022-12-15 NOTE — Therapy (Signed)
OUTPATIENT PHYSICAL THERAPY THORACOLUMBAR EVALUATION   Patient Name: Jodi Mckenzie MRN: [id number] DOB:[birth year], 22 y.o., female Today's Date: 12/15/2022  END OF SESSION:  PT End of Session - 12/15/22 1309     Visit Number 1    Date for PT Re-Evaluation 02/09/23    Authorization Type Medicaid UHC- visits requested as pt is under 23    PT Start Time 1233    PT Stop Time 1311    PT Time Calculation (min) 38 min    Activity Tolerance Patient tolerated treatment well    Behavior During Therapy WFL for tasks assessed/performed             Past Medical History:  Diagnosis Date   GERD (gastroesophageal reflux disease)    Past Surgical History:  Procedure Laterality Date   NO PAST SURGERIES     Patient Active Problem List   Diagnosis Date Noted   Cystic fibrosis carrier, 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/15/2022   Supervision of other normal pregnancy, antepartum 09/30/2022     REFERRING PROVIDER: Sharen Counter  REFERRING DIAG: O99.891,M54.9 (ICD-10-CM) - Back pain affecting pregnancy in second trimester   Rationale for Evaluation and Treatment: Rehabilitation  THERAPY DIAG:  Other low back pain - Plan: PT plan of care cert/re-cert  Muscle weakness (generalized) - Plan: PT plan of care cert/re-cert  ONSET DATE: 1 month ago (lower pelvic pain and urge incontinence)  SUBJECTIVE:                                                                                                                                                                                           SUBJECTIVE STATEMENT: Pt is pregnant (18 w, 5 days) with due date of 05/13/23.  She reports 1 month history of low back pain and lower pelvic pressure.  She is limited to standing 1 hour with housework and care of her child.   PERTINENT HISTORY:  1 prior pregnancy- vacuum assistance delivery 1.5 years ago  PAIN:  Are you having pain? Yes: NPRS scale: 0-6/10 Pain location: lower pelvis (pressure), low back pain Pain  description: pressure, cramping Aggravating factors: standing, walking  Relieving factors: wearing belly band, sitting/supine  PRECAUTIONS: Other: Pt is pregnant   RED FLAGS: None   WEIGHT BEARING RESTRICTIONS: No  FALLS:  Has patient fallen in last 6 months? No  LIVING ENVIRONMENT: Lives with: lives with their family Lives in: House/apartment Stairs: No Has following equipment at home: None  OCCUPATION: no- has a 3 month old child   PLOF: Independent and Leisure: none, cares for 34 month old  PATIENT GOALS: reduce pain, stand and do housework/walk in the community without  limitation  NEXT MD VISIT: next week/regular check-up  OBJECTIVE:   DIAGNOSTIC FINDINGS:  None   PATIENT SURVEYS:  Modified Oswestry 6% disability   SCREENING FOR RED FLAGS: Bowel or bladder incontinence: No Spinal tumors: No Cauda equina syndrome: No Compression fracture: No Abdominal aneurysm: No  COGNITION: Overall cognitive status: Within functional limits for tasks assessed     SENSATION: WFL  MUSCLE LENGTH: Rt hamstring limited by 20% vs the Rt  POSTURE: flexed trunk   PALPATION: Tension in bil lumbar paraspinals with mild pain reported.  No palpable pain in lower abdominals, SI joint or pubic symphysis  LUMBAR ROM:   AROM eval  Flexion Limited by 50%  Extension   Right lateral flexion Limited by 25%  Left lateral flexion Limited by 25%  Right rotation   Left rotation    (Blank rows = not tested)  LOWER EXTREMITY ROM:    Rt hip limited by 20% vs the Lt  LOWER EXTREMITY MMT:    4+/5 bil hip and knee strength, core: fair TA activation, diastasis recti with change of positions   GAIT: Distance walked: 50 Assistive device utilized: None Level of assistance: Complete Independence Comments: reduced time on Rt LE with limited trunk rotation  TODAY'S TREATMENT:                                                                                                                               DATE: 12/15/22 HEP established- see below     If treatment provided at initial evaluation, no treatment charged due to lack of authorization.       PATIENT EDUCATION:  Education details: Access Code: [email or web address] Person educated: Patient Education method: Explanation, Demonstration, and Handouts Education comprehension: verbalized understanding and returned demonstration  HOME EXERCISE PROGRAM: Access Code: [email or web address] URL: [email or web address] Date: 12/15/2022 Prepared by: Tresa Endo  Exercises - Sidelying Open Book Thoracic Lumbar Rotation and Extension  - 2-3 x daily - 7 x weekly - 1 sets - 10 reps - Half Kneeling Hip Flexor Stretch with Sidebend  - 2 x daily - 7 x weekly - 1 sets - 3 reps - 20 hold - Hooklying Transversus Abdominis Palpation  - 1 x daily - 7 x weekly - 3 sets - 10 reps - Seated Transversus Abdominis Bracing  - 1 x daily - 7 x weekly - 1 sets - 10 reps - Clamshell  - 2 x daily - 7 x weekly - 2 sets - 5-10 reps  ASSESSMENT:  CLINICAL IMPRESSION: Patient is a 22 y.o. female who was seen today for physical therapy evaluation and treatment for LBP in her 2nd trimester of pregnancy. Pt also reports lower pelvic pressure and feeling of urge when she needs to urinate.  Symptoms began ~1 month ago.  She is limited to standing and walking <1 hour due to pain in low back and lower pelvic pressure.  Reduced core strength and activation of TA today.  Pt will  benefit from skilled PT to improve lumbopelvic strength and mobility to support abdominal changes throughout pregnancy and reduce pain.  Patient will benefit from skilled PT to address the below impairments and improve overall function.   OBJECTIVE IMPAIRMENTS: Abnormal gait, decreased activity tolerance, decreased strength, hypomobility, and pain.   ACTIVITY LIMITATIONS: lifting, standing, locomotion level, and caring for others  PARTICIPATION LIMITATIONS: meal prep, cleaning, laundry, and community  activity  PERSONAL FACTORS: 1 comorbidity: 1 recent pregnancy 18 months ago  are also affecting patient's functional outcome.   REHAB POTENTIAL: Good  CLINICAL DECISION MAKING: Stable/uncomplicated  EVALUATION COMPLEXITY: Low   GOALS: Goals reviewed with patient? Yes  SHORT TERM GOALS: Target date: 01/12/2023    Be independent in initial HEP Baseline: Goal status: INITIAL  2.  Report > or = to 30% reduction in LBP with standing to care for her child  Baseline: 6/10 Goal status: INITIAL  3.  Stand for 1 hour for ADLS and care of child without limitation Baseline: 45 minutes  Goal status: INITIAL    LONG TERM GOALS: Target date: 02/09/2023    Be independent in advanced HEP Baseline:  Goal status: INITIAL  2.  Improve ODI to < or = to 2% disability  Baseline: 6% Goal status: INITIAL  3.  Report > or = to 60% reduction in LBP with standing tasks  Baseline: 6/10 Goal status: INITIAL  4.  Stand for > or = to 1.5 hours for ADLs and care of her child without limitation due to pain/pressure Baseline: 45 minutes  Goal status: INITIAL  5.  Improve strength and muscular activation to report > or = to 40% reduction in pelvic pressure in standing  Baseline: pelvic pressure with all standing  Goal status: INITIAL    PLAN:  PT FREQUENCY: 2x/week  PT DURATION: 8 weeks  PLANNED INTERVENTIONS: Therapeutic exercises, Therapeutic activity, Neuromuscular re-education, Balance training, Gait training, Patient/Family education, Self Care, Joint mobilization, Stair training, Aquatic Therapy, Dry Needling, Spinal mobilization, Cryotherapy, Moist heat, Taping, and Manual therapy.  PLAN FOR NEXT SESSION: review HEP, core and pelvic floor activation, body mechanics modifications, hip and lumbar flexibility   Lorrene Reid, PT 12/15/22 1:31 PM

## 2022-12-17 ENCOUNTER — Ambulatory Visit: Payer: Medicaid Other | Admitting: *Deleted

## 2022-12-17 ENCOUNTER — Encounter: Payer: Self-pay | Admitting: *Deleted

## 2022-12-17 ENCOUNTER — Ambulatory Visit: Payer: Medicaid Other | Attending: Family Medicine

## 2022-12-17 VITALS — BP 117/54 | HR 84

## 2022-12-17 DIAGNOSIS — Z141 Cystic fibrosis carrier: Secondary | ICD-10-CM | POA: Insufficient documentation

## 2022-12-17 DIAGNOSIS — Z363 Encounter for antenatal screening for malformations: Secondary | ICD-10-CM | POA: Diagnosis not present

## 2022-12-17 DIAGNOSIS — Z3A19 19 weeks gestation of pregnancy: Secondary | ICD-10-CM | POA: Diagnosis not present

## 2022-12-17 DIAGNOSIS — Z3480 Encounter for supervision of other normal pregnancy, unspecified trimester: Secondary | ICD-10-CM | POA: Insufficient documentation

## 2022-12-17 DIAGNOSIS — O09899 Supervision of other high risk pregnancies, unspecified trimester: Secondary | ICD-10-CM | POA: Insufficient documentation

## 2022-12-17 DIAGNOSIS — O285 Abnormal chromosomal and genetic finding on antenatal screening of mother: Secondary | ICD-10-CM

## 2022-12-21 ENCOUNTER — Encounter: Payer: Self-pay | Admitting: Advanced Practice Midwife

## 2022-12-21 ENCOUNTER — Ambulatory Visit (INDEPENDENT_AMBULATORY_CARE_PROVIDER_SITE_OTHER): Payer: Medicaid Other | Admitting: Obstetrics and Gynecology

## 2022-12-21 VITALS — BP 104/68 | HR 81 | Wt 124.2 lb

## 2022-12-21 DIAGNOSIS — O09899 Supervision of other high risk pregnancies, unspecified trimester: Secondary | ICD-10-CM

## 2022-12-21 DIAGNOSIS — Z141 Cystic fibrosis carrier: Secondary | ICD-10-CM

## 2022-12-21 DIAGNOSIS — Z3A19 19 weeks gestation of pregnancy: Secondary | ICD-10-CM | POA: Insufficient documentation

## 2022-12-21 DIAGNOSIS — Z3480 Encounter for supervision of other normal pregnancy, unspecified trimester: Secondary | ICD-10-CM

## 2022-12-21 NOTE — Progress Notes (Signed)
   PRENATAL VISIT NOTE  Subjective:  Jodi Mckenzie is a 22 y.o. G2P1001 at [redacted]w[redacted]d being seen today for ongoing prenatal care.  She is currently monitored for the following issues for this low-risk pregnancy and has Supervision of other normal pregnancy, antepartum; Cystic fibrosis carrier, antepartum; and [redacted] weeks gestation of pregnancy on their problem list.  Patient reports backache. Going to PT, and this has been helpful. Denies leaking of fluid, ctx, VB.  The following portions of the patient's history were reviewed and updated as appropriate: allergies, current medications, past family history, past medical history, past social history, past surgical history and problem list.   Objective:   Vitals:   12/21/22 0835  BP: 104/68  Pulse: 81  Weight: 124 lb 3.2 oz (56.3 kg)    Fetal Status: Fetal Heart Rate (bpm): 164   Movement: Present     General:  Alert, oriented and cooperative. Patient is in no acute distress.  Skin: Skin is warm and dry. No rash noted.   Cardiovascular: Normal heart rate noted  Respiratory: Normal respiratory effort, no problems with respiration noted  Abdomen: Soft, gravid, appropriate for gestational age.  Pain/Pressure: Absent     Pelvic: N/A  Extremities: Normal range of motion.  Edema: None  Mental Status: Normal mood and affect. Normal behavior. Normal judgment and thought content.   Assessment and Plan:  Pregnancy: G2P1001 at [redacted]w[redacted]d  1. Supervision of other normal pregnancy, antepartum Anticipatory guidance given.  2. Cystic fibrosis carrier, antepartum FOB has genetic test kit.  3. [redacted] weeks gestation of pregnancy   Preterm labor symptoms and general obstetric precautions including but not limited to vaginal bleeding, contractions, leaking of fluid and fetal movement were reviewed in detail with the patient. Please refer to After Visit Summary for other counseling recommendations.   Return in about 4 weeks (around 01/18/2023) for 24  ROB.  Future Appointments  Date Time Provider Department Center  12/29/2022 11:45 AM Lavinia Sharps C, PT [facility] None  12/31/2022  8:45 AM Lavinia Sharps C, PT [facility] None  01/05/2023  9:30 AM Edrick Oh, PT [facility] None  01/07/2023  9:30 AM Lavinia Sharps C, PT [facility] None  01/12/2023  9:30 AM Edrick Oh, PT [facility] None  01/14/2023  8:45 AM Lavinia Sharps C, PT [facility] None  01/19/2023  9:30 AM Edrick Oh, PT [facility] None  01/19/2023 10:35 AM Lennart Pall, MD [facility] None  01/21/2023  8:45 AM Lavinia Sharps C, PT [facility] None  01/26/2023  9:30 AM Edrick Oh, PT [facility] None  01/28/2023  8:00 AM Vivien Presto, PT [facility] None  02/02/2023  9:30 AM Edrick Oh, PT [facility] None  02/04/2023  9:30 AM Edrick Oh, PT [facility] None  02/09/2023  9:30 AM Takacs, Ivory Broad, PT [facility] None    Joanne Gavel, MD

## 2022-12-21 NOTE — Progress Notes (Signed)
Pt presents for ROB visit. No concerns.  

## 2022-12-29 ENCOUNTER — Telehealth: Payer: Self-pay | Admitting: Physical Therapy

## 2022-12-29 ENCOUNTER — Ambulatory Visit: Payer: Medicaid Other | Attending: Advanced Practice Midwife | Admitting: Physical Therapy

## 2022-12-29 DIAGNOSIS — M5459 Other low back pain: Secondary | ICD-10-CM | POA: Insufficient documentation

## 2022-12-29 DIAGNOSIS — M6281 Muscle weakness (generalized): Secondary | ICD-10-CM | POA: Insufficient documentation

## 2022-12-29 NOTE — Telephone Encounter (Signed)
Called and left message on VM for patient to call facility (regarding no-show appt)

## 2022-12-31 ENCOUNTER — Ambulatory Visit: Payer: Medicaid Other | Admitting: Physical Therapy

## 2022-12-31 ENCOUNTER — Other Ambulatory Visit: Payer: Self-pay

## 2022-12-31 ENCOUNTER — Inpatient Hospital Stay (HOSPITAL_COMMUNITY)
Admission: AD | Admit: 2022-12-31 | Discharge: 2022-12-31 | Disposition: A | Discharge status: Home / Self Care | Payer: Medicaid Other | Attending: Obstetrics and Gynecology | Admitting: Obstetrics and Gynecology

## 2022-12-31 ENCOUNTER — Encounter (HOSPITAL_COMMUNITY): Payer: Self-pay | Admitting: Obstetrics and Gynecology

## 2022-12-31 DIAGNOSIS — M5431 Sciatica, right side: Secondary | ICD-10-CM | POA: Diagnosis not present

## 2022-12-31 DIAGNOSIS — Z3A21 21 weeks gestation of pregnancy: Secondary | ICD-10-CM | POA: Insufficient documentation

## 2022-12-31 DIAGNOSIS — O26892 Other specified pregnancy related conditions, second trimester: Secondary | ICD-10-CM | POA: Diagnosis not present

## 2022-12-31 DIAGNOSIS — M5441 Lumbago with sciatica, right side: Secondary | ICD-10-CM | POA: Diagnosis not present

## 2022-12-31 DIAGNOSIS — M5459 Other low back pain: Secondary | ICD-10-CM | POA: Diagnosis present

## 2022-12-31 DIAGNOSIS — M6281 Muscle weakness (generalized): Secondary | ICD-10-CM | POA: Diagnosis present

## 2022-12-31 DIAGNOSIS — R109 Unspecified abdominal pain: Secondary | ICD-10-CM | POA: Diagnosis present

## 2022-12-31 LAB — URINALYSIS, ROUTINE W REFLEX MICROSCOPIC
Bilirubin Urine: NEGATIVE
Glucose, UA: NEGATIVE mg/dL
Hgb urine dipstick: NEGATIVE
Ketones, ur: NEGATIVE mg/dL
Nitrite: NEGATIVE
Protein, ur: NEGATIVE mg/dL
Specific Gravity, Urine: 1.009 (ref 1.005–1.030)
pH: 7 (ref 5.0–8.0)

## 2022-12-31 MED ORDER — CYCLOBENZAPRINE HCL 5 MG PO TABS: 5.0000 mg | ORAL_TABLET | Freq: Three times a day (TID) | ORAL | 0 refills | Status: DC | PRN

## 2022-12-31 MED ORDER — CYCLOBENZAPRINE HCL 5 MG PO TABS
5.0000 mg | ORAL_TABLET | Freq: Once | ORAL | Status: AC
  Administered 2022-12-31: 5 mg via ORAL
  Filled 2022-12-31: qty 1

## 2022-12-31 NOTE — MAU Note (Addendum)
.  Jodi Mckenzie is a 22 y.o. at [redacted]w[redacted]d here in MAU reporting:  Pt reports this morning she had physical therapy and after an hour of being home she started having lower abdominal pain and cramping. Pt reports it has come and gone all day. Pt reports its moving to the bottom of her stomach and feels like pelvic pressure  Onset of complaint: today  Pain score: 6 There were no vitals filed for this visit.   FHT: 160 Lab orders placed from triage:   ua

## 2022-12-31 NOTE — Patient Instructions (Signed)
Theraband exercise ball 65 cm size  Look for anti-burst feature in description   For sleeping: pillow b/w knees   Belly Band customize the size; OK to use off-brand

## 2022-12-31 NOTE — Therapy (Signed)
OUTPATIENT PHYSICAL THERAPY THORACOLUMBAR PROGRESS NOTE   Patient Name: Jodi Mckenzie MRN: [id number] DOB:09-26-2000, 22 y.o., female Today's Date: 12/31/2022  END OF SESSION:  PT End of Session - 12/31/22 0848     Visit Number 2    Number of Visits 8    Authorization Type Medicaid UHC- 8 visits 7/31-9/24    PT Start Time 0846    PT Stop Time 0929    PT Time Calculation (min) 43 min    Activity Tolerance Patient tolerated treatment well             Past Medical History:  Diagnosis Date   GERD (gastroesophageal reflux disease)    Past Surgical History:  Procedure Laterality Date   NO PAST SURGERIES     Patient Active Problem List   Diagnosis Date Noted   [redacted] weeks gestation of pregnancy 12/21/2022   Cystic fibrosis carrier, 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/15/2022   Supervision of other normal pregnancy, antepartum 09/30/2022     REFERRING PROVIDER: Sharen Counter  REFERRING DIAG: O99.891,M54.9 (ICD-10-CM) - Back pain affecting pregnancy in second trimester   Rationale for Evaluation and Treatment: Rehabilitation  THERAPY DIAG:  Other low back pain  Muscle weakness (generalized)  ONSET DATE: 1 month ago (lower pelvic pain and urge incontinence)  SUBJECTIVE:                                                                                                                                                                                           SUBJECTIVE STATEMENT: I've done some of the ex's but having a 85 month old makes it hard to do them.  Pain limits walking.    PERTINENT HISTORY:  1 prior pregnancy- vacuum assistance delivery 1.5 years ago  PAIN:  Are you having pain? Yes: NPRS scale: 3/10 Pain location: left low back pain Pain description: pressure, cramping Aggravating factors: standing, walking  Relieving factors: wearing belly band, sitting/supine  PRECAUTIONS: Other: Pt is pregnant   RED FLAGS: None   WEIGHT BEARING RESTRICTIONS: No  FALLS:  Has  patient fallen in last 6 months? No  LIVING ENVIRONMENT: Lives with: lives with their family Lives in: House/apartment Stairs: No Has following equipment at home: None  OCCUPATION: no- has a 55 month old child   PLOF: Independent and Leisure: none, cares for 27 month old  PATIENT GOALS: reduce pain, stand and do housework/walk in the community without limitation  NEXT MD VISIT: next week/regular check-up  OBJECTIVE:   DIAGNOSTIC FINDINGS:  None   PATIENT SURVEYS:  Modified Oswestry 6% disability   SCREENING FOR RED FLAGS: Bowel or bladder incontinence: No Spinal tumors: No Cauda  equina syndrome: No Compression fracture: No Abdominal aneurysm: No  COGNITION: Overall cognitive status: Within functional limits for tasks assessed     SENSATION: WFL  MUSCLE LENGTH: Rt hamstring limited by 20% vs the Rt  POSTURE: flexed trunk   PALPATION: Tension in bil lumbar paraspinals with mild pain reported.  No palpable pain in lower abdominals, SI joint or pubic symphysis  LUMBAR ROM:   AROM eval  Flexion Limited by 50%  Extension   Right lateral flexion Limited by 25%  Left lateral flexion Limited by 25%  Right rotation   Left rotation    (Blank rows = not tested)  LOWER EXTREMITY ROM:    Rt hip limited by 20% vs the Lt  LOWER EXTREMITY MMT:    4+/5 bil hip and knee strength, core: fair TA activation, diastasis recti with change of positions   GAIT: Distance walked: 50 Assistive device utilized: None Level of assistance: Complete Independence Comments: reduced time on Rt LE with limited trunk rotation  TODAY'S TREATMENT:                                                                                                                              DATE: 815/24 Discussion of use of belly bands for walking  Use of pillow b/w legs for sleeping Open books with bolster under leg 10x right/left Supine ab brace with mod verbal cues to avoid holding breath Clams 10x  right /left cues to exhale as lifting leg (tends to hold breath) 1/2 kneel with posterior pelvic tilt/hip flexor stretch with UE reach up and over 10x right/left Sitting on ball pelvic mobility all directions: anterior/posterior, side to side, circles; hip adductor stretching with lateral movements on ball Pt given info on ball at her request as these felt good to perform (see pt instructions handout)        PATIENT EDUCATION:  Education details: Access Code: [email or web address] Person educated: Patient Education method: Explanation, Demonstration, and Handouts Education comprehension: verbalized understanding and returned demonstration  HOME EXERCISE PROGRAM: Access Code: [email or web address] URL: [email or web address] Date: 12/15/2022 Prepared by: Tresa Endo  Exercises - Sidelying Open Book Thoracic Lumbar Rotation and Extension  - 2-3 x daily - 7 x weekly - 1 sets - 10 reps - Half Kneeling Hip Flexor Stretch with Sidebend  - 2 x daily - 7 x weekly - 1 sets - 3 reps - 20 hold - Hooklying Transversus Abdominis Palpation  - 1 x daily - 7 x weekly - 3 sets - 10 reps - Seated Transversus Abdominis Bracing  - 1 x daily - 7 x weekly - 1 sets - 10 reps - Clamshell  - 2 x daily - 7 x weekly - 2 sets - 5-10 reps  ASSESSMENT:  CLINICAL IMPRESSION: Patient responds well to low level mobility and core/pelvic muscle activation ex's.  We discussed the benefits of exercise for pain improvement as well as strengthening needed for postural support during pregnancy and  taking  care of her 27 month old son.  Verbal cues for exhalation on exertion to avoid Valsalva maneuver and optimize muscular activation.     OBJECTIVE IMPAIRMENTS: Abnormal gait, decreased activity tolerance, decreased strength, hypomobility, and pain.   ACTIVITY LIMITATIONS: lifting, standing, locomotion level, and caring for others  PARTICIPATION LIMITATIONS: meal prep, cleaning, laundry, and community activity  PERSONAL FACTORS: 1  comorbidity: 1 recent pregnancy 18 months ago  are also affecting patient's functional outcome.   REHAB POTENTIAL: Good  CLINICAL DECISION MAKING: Stable/uncomplicated  EVALUATION COMPLEXITY: Low   GOALS: Goals reviewed with patient? Yes  SHORT TERM GOALS: Target date: 01/12/2023    Be independent in initial HEP Baseline: Goal status: INITIAL  2.  Report > or = to 30% reduction in LBP with standing to care for her child  Baseline: 6/10 Goal status: INITIAL  3.  Stand for 1 hour for ADLS and care of child without limitation Baseline: 45 minutes  Goal status: INITIAL    LONG TERM GOALS: Target date: 02/09/2023    Be independent in advanced HEP Baseline:  Goal status: INITIAL  2.  Improve ODI to < or = to 2% disability  Baseline: 6% Goal status: INITIAL  3.  Report > or = to 60% reduction in LBP with standing tasks  Baseline: 6/10 Goal status: INITIAL  4.  Stand for > or = to 1.5 hours for ADLs and care of her child without limitation due to pain/pressure Baseline: 45 minutes  Goal status: INITIAL  5.  Improve strength and muscular activation to report > or = to 40% reduction in pelvic pressure in standing  Baseline: pelvic pressure with all standing  Goal status: INITIAL    PLAN:  PT FREQUENCY: 2x/week  PT DURATION: 8 weeks  PLANNED INTERVENTIONS: Therapeutic exercises, Therapeutic activity, Neuromuscular re-education, Balance training, Gait training, Patient/Family education, Self Care, Joint mobilization, Stair training, Aquatic Therapy, Dry Needling, Spinal mobilization, Cryotherapy, Moist heat, Taping, and Manual therapy.  PLAN FOR NEXT SESSION:  core and pelvic floor activation and progress (encourage exhale on exertion), body mechanics modifications, hip and lumbar flexibility   Lavinia Sharps, PT 12/31/22 2:19 PM Phone: [phone] Fax: [phone]

## 2022-12-31 NOTE — MAU Provider Note (Signed)
Chief Complaint:  Back Pain   Event Date/Time   First Provider Initiated Contact with Patient 12/31/22 2106     HPI: Tayla Fresco is a 22 y.o. G2P1001 at 58w0dwho presents to maternity admissions reporting cramping in lower back, mostly around right sciatic joint, radiating around to pelvis.  This was new for her (has been having back pain and is in PT for that), has never had pain come around to abdomen. . She denies LOF, vaginal bleeding, urinary symptoms, h/a, n/v, or fever/chills.   Back Pain This is a recurrent problem. The current episode started today. The pain is present in the sacro-iliac. The quality of the pain is described as aching and cramping. Radiates to: lower abdomen. Exacerbated by: walking. Stiffness is present All day. Pertinent negatives include no dysuria, fever, headaches, leg pain or weakness. She has tried nothing for the symptoms.   RN Note: Khrysta Bunts is a 22 y.o. at [redacted]w[redacted]d here in MAU reporting:  Pt reports this morning she had physical therapy and after an hour of being home she started having lower abdominal pain and cramping. Pt reports it has come and gone all day. Pt reports its moving to the bottom of her stomach and feels like pelvic pressure  Onset of complaint: today  Pain score: 6  Past Medical History: Past Medical History:  Diagnosis Date   GERD (gastroesophageal reflux disease)     Past obstetric history: OB History  Gravida Para Term Preterm AB Living  2 1 1     1   SAB IAB Ectopic Multiple Live Births        0 1    # Outcome Date GA Lbr Len/2nd Weight Sex Type Anes PTL Lv  2 Current           1 Term 06/05/21 [redacted]w[redacted]d  3841 g M Vag-Vacuum None, Local  LIV    Past Surgical History: Past Surgical History:  Procedure Laterality Date   NO PAST SURGERIES      Family History: Family History  Problem Relation Age of Onset   Thyroid disease Mother    Diabetes Father     Social History: Social History   Tobacco Use   Smoking  status: Never   Smokeless tobacco: Never  Vaping Use   Vaping status: Never Used  Substance Use Topics   Alcohol use: Never   Drug use: Never    Allergies:  Allergies  Allergen Reactions   Latex Itching and Hives    Meds:  Medications Prior to Admission  Medication Sig Dispense Refill Last Dose   Blood Pressure Monitoring (BLOOD PRESSURE KIT) DEVI 1 kit by Does not apply route once a week. 1 each 0    Misc. Devices (GOJJI WEIGHT SCALE) MISC 1 Device by Does not apply route every 30 (thirty) days. 1 each 0    prenatal vitamin w/FE, FA (PRENATAL 1 + 1) 27-1 MG TABS tablet Take 1 tablet by mouth daily at 12 noon.       I have reviewed patient's Past Medical Hx, Surgical Hx, Family Hx, Social Hx, medications and allergies.   ROS:  Review of Systems  Constitutional:  Negative for fever.  Genitourinary:  Negative for dysuria.  Musculoskeletal:  Positive for back pain.  Neurological:  Negative for weakness and headaches.   Other systems negative  Physical Exam  Patient Vitals for the past 24 hrs:  Weight  12/31/22 2105 59.1 kg   Constitutional: Well-developed, well-nourished female in no acute distress.  Cardiovascular: normal rate and rhythm Respiratory: normal effort, clear to auscultation bilaterally GI: Abd soft, non-tender, gravid appropriate for gestational age.   No rebound or guarding. MS: Extremities nontender, no edema, normal ROM Neurologic: Alert and oriented x 4.  GU: Neg CVAT.  PELVIC EXAM:  deferred  FHT:  160   Labs: Results for orders placed or performed during the hospital encounter of 12/31/22 (from the past 24 hour(s))  Urinalysis, Routine w reflex microscopic -Urine, Clean Catch     Status: Abnormal   Collection Time: 12/31/22  9:10 PM  Result Value Ref Range   Color, Urine YELLOW YELLOW   APPearance CLOUDY (A) CLEAR   Specific Gravity, Urine 1.009 1.005 - 1.030   pH 7.0 5.0 - 8.0   Glucose, UA NEGATIVE NEGATIVE mg/dL   Hgb urine dipstick  NEGATIVE NEGATIVE   Bilirubin Urine NEGATIVE NEGATIVE   Ketones, ur NEGATIVE NEGATIVE mg/dL   Protein, ur NEGATIVE NEGATIVE mg/dL   Nitrite NEGATIVE NEGATIVE   Leukocytes,Ua SMALL (A) NEGATIVE   RBC / HPF 0-5 0 - 5 RBC/hpf   WBC, UA 6-10 0 - 5 WBC/hpf   Bacteria, UA FEW (A) NONE SEEN   Squamous Epithelial / HPF 0-5 0 - 5 /HPF   Amorphous Crystal PRESENT     O/Positive/-- (05/15 1422)  Imaging:    MAU Course/MDM: I have reviewed the triage vital signs and the nursing notes.   Pertinent labs & imaging results that were available during my care of the patient were reviewed by me and considered in my medical decision making (see chart for details).      I have reviewed her medical records including past results, notes and treatments.   I have ordered labs and reviewed results. UA is clear   Treatments in MAU included Flexeril given with good relief of pain.  Discussed continuing Physical Therapy.  Rx Flexeril for prn use for spasm  Assessment: [place] IUP at [redacted]w[redacted]d Right lower back pain consistent with Sciatica  Plan: Discharge home Advised to followup with Physical Therapy and notify them of new sciatica pain Follow up in Office for prenatal visits  Encouraged to return if she develops worsening of symptoms, increase in pain, fever, or other concerning symptoms.   Pt stable at time of discharge.  Wynelle Bourgeois CNM, MSN Certified Nurse-Midwife 12/31/2022 9:06 PM

## 2023-01-05 ENCOUNTER — Ambulatory Visit: Payer: Medicaid Other

## 2023-01-05 DIAGNOSIS — M6281 Muscle weakness (generalized): Secondary | ICD-10-CM

## 2023-01-05 DIAGNOSIS — M5459 Other low back pain: Secondary | ICD-10-CM | POA: Diagnosis not present

## 2023-01-05 NOTE — Therapy (Signed)
OUTPATIENT PHYSICAL THERAPY THORACOLUMBAR TREATMENT   Patient Name: Jodi Mckenzie MRN: [id number] DOB:13-Jun-2000, 22 y.o., female Today's Date: 01/05/2023  END OF SESSION:  PT End of Session - 01/05/23 1015     Visit Number 3    Date for PT Re-Evaluation 02/09/23    Authorization Type Medicaid [employer]- 8 visits 7/31-9/24    PT Start Time 0930    PT Stop Time 1012    PT Time Calculation (min) 42 min    Activity Tolerance Patient tolerated treatment well    Behavior During Therapy WFL for tasks assessed/performed              Past Medical History:  Diagnosis Date   GERD (gastroesophageal reflux disease)    Past Surgical History:  Procedure Laterality Date   NO PAST SURGERIES     Patient Active Problem List   Diagnosis Date Noted   [redacted] weeks gestation of pregnancy 12/21/2022   Cystic fibrosis carrier, 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/15/2022   Supervision of other normal pregnancy, antepartum 09/30/2022     REFERRING PROVIDER: Sharen Counter  REFERRING DIAG: O99.891,M54.9 (ICD-10-CM) - Back pain affecting pregnancy in second trimester   Rationale for Evaluation and Treatment: Rehabilitation  THERAPY DIAG:  Other low back pain  Muscle weakness (generalized)  ONSET DATE: 1 month ago (lower pelvic pain and urge incontinence)  SUBJECTIVE:                                                                                                                                                                                           SUBJECTIVE STATEMENT: I had to go to the ED with "cramping".  They gave me a muscle relaxer.  I am wearing my Belly Band in the afternoons.  I got a ball for sitting to exercise.   PERTINENT HISTORY:  1 prior pregnancy- vacuum assistance delivery 1.5 years ago  PAIN:  Are you having pain? Yes: NPRS scale: 4/10 Pain location: left low back pain Pain description: pressure, cramping Aggravating factors: standing, walking  Relieving factors: wearing belly  band, sitting/supine  PRECAUTIONS: Other: Pt is pregnant   RED FLAGS: None   WEIGHT BEARING RESTRICTIONS: No  FALLS:  Has patient fallen in last 6 months? No  LIVING ENVIRONMENT: Lives with: lives with their family Lives in: House/apartment Stairs: No Has following equipment at home: None  OCCUPATION: no- has a 59 month old child   PLOF: Independent and Leisure: none, cares for 54 month old  PATIENT GOALS: reduce pain, stand and do housework/walk in the community without limitation  NEXT MD VISIT: next week/regular check-up  OBJECTIVE:   DIAGNOSTIC FINDINGS:  None  PATIENT SURVEYS:  Modified Oswestry 6% disability   SCREENING FOR RED FLAGS: Bowel or bladder incontinence: No Spinal tumors: No Cauda equina syndrome: No Compression fracture: No Abdominal aneurysm: No  COGNITION: Overall cognitive status: Within functional limits for tasks assessed     SENSATION: WFL  MUSCLE LENGTH: Rt hamstring limited by 20% vs the Rt  POSTURE: flexed trunk   PALPATION: Tension in bil lumbar paraspinals with mild pain reported.  No palpable pain in lower abdominals, SI joint or pubic symphysis  LUMBAR ROM:   AROM eval  Flexion Limited by 50%  Extension   Right lateral flexion Limited by 25%  Left lateral flexion Limited by 25%  Right rotation   Left rotation    (Blank rows = not tested)  LOWER EXTREMITY ROM:    Rt hip limited by 20% vs the Lt  LOWER EXTREMITY MMT:    4+/5 bil hip and knee strength, core: fair TA activation, diastasis recti with change of positions   GAIT: Distance walked: 50 Assistive device utilized: None Level of assistance: Complete Independence Comments: reduced time on Rt LE with limited trunk rotation  TODAY'S TREATMENT:    DATE: 01/05/23 Open books with bolster under leg 10x right/left Supine ab brace with mod verbal cues to avoid holding breath with ball squeeze 5" hold 2x10 Supine clam with ab brace blue loop 2x10 1/2 kneel  with posterior pelvic tilt/hip flexor stretch with UE reach up and over 10x right/left Low trunk rotation 2x20 seconds  Seated hamstring stretch 3x20 seconds bil  Sitting on ball pelvic mobility all directions: anterior/posterior, side to side, circles; hip adductor stretching with lateral movements on ball Sit to stand with TA activation x15- verbal cues to control descent  Seated figure 4 3x20 seconds                                                                                                               DATE: 815/24 Discussion of use of belly bands for walking  Use of pillow b/w legs for sleeping Open books with bolster under leg 10x right/left Supine ab brace with mod verbal cues to avoid holding breath Clams 10x right /left cues to exhale as lifting leg (tends to hold breath) 1/2 kneel with posterior pelvic tilt/hip flexor stretch with UE reach up and over 10x right/left Sitting on ball pelvic mobility all directions: anterior/posterior, side to side, circles; hip adductor stretching with lateral movements on ball Pt given info on ball at her request as these felt good to perform (see pt instructions handout)     PATIENT EDUCATION:  Education details: Access Code: [email or web address] Person educated: Patient Education method: Explanation, Demonstration, and Handouts Education comprehension: verbalized understanding and returned demonstration  HOME EXERCISE PROGRAM: Access Code: [email or web address] URL: [email or web address] Date: 01/05/2023 Prepared by: Tresa Endo  Exercises - Sidelying Open Book Thoracic Lumbar Rotation and Extension  - 2-3 x daily - 7 x weekly - 1 sets - 10 reps - Half Kneeling Hip Flexor Stretch with Sidebend  - 2 x daily -  7 x weekly - 1 sets - 3 reps - 20 hold - Hooklying Transversus Abdominis Palpation  - 1 x daily - 7 x weekly - 3 sets - 10 reps - Seated Transversus Abdominis Bracing  - 1 x daily - 7 x weekly - 1 sets - 10 reps - Clamshell  - 2 x daily - 7 x  weekly - 2 sets - 5-10 reps - Seated Hamstring Stretch  - 2 x daily - 7 x weekly - 1 sets - 3 reps - 20 hold - Seated Piriformis Stretch with Trunk Bend  - 2 x daily - 7 x weekly - 1 sets - 3 reps - 20 hold  ASSESSMENT:  CLINICAL IMPRESSION: Pt went to the ED over the weekend with symptoms of lower abdominal cramping and low back pain.  She was discharged with prescription for muscle relaxers but has not taken these.  Pain is improved since this visit. Pt has a ball at home and has been doing pelvic mobility exercises daily.  Fewer verbal cues for exhalation on exertion to avoid Valsalva maneuver today.  She tolerated all exercise well and pain level reduced to 2/10 at the end of session.  Patient will benefit from skilled PT to address the below impairments and improve overall function.    OBJECTIVE IMPAIRMENTS: Abnormal gait, decreased activity tolerance, decreased strength, hypomobility, and pain.   ACTIVITY LIMITATIONS: lifting, standing, locomotion level, and caring for others  PARTICIPATION LIMITATIONS: meal prep, cleaning, laundry, and community activity  PERSONAL FACTORS: 1 comorbidity: 1 recent pregnancy 18 months ago  are also affecting patient's functional outcome.   REHAB POTENTIAL: Good  CLINICAL DECISION MAKING: Stable/uncomplicated  EVALUATION COMPLEXITY: Low   GOALS: Goals reviewed with patient? Yes  SHORT TERM GOALS: Target date: 01/12/2023    Be independent in initial HEP Baseline: Goal status: In progress   2.  Report > or = to 30% reduction in LBP with standing to care for her child  Baseline: 6/10 Goal status: INITIAL  3.  Stand for 1 hour for ADLS and care of child without limitation Baseline: 45 minutes  Goal status: INITIAL    LONG TERM GOALS: Target date: 02/09/2023    Be independent in advanced HEP Baseline:  Goal status: INITIAL  2.  Improve ODI to < or = to 2% disability  Baseline: 6% Goal status: INITIAL  3.  Report > or = to 60%  reduction in LBP with standing tasks  Baseline: 6/10 Goal status: INITIAL  4.  Stand for > or = to 1.5 hours for ADLs and care of her child without limitation due to pain/pressure Baseline: 45 minutes  Goal status: INITIAL  5.  Improve strength and muscular activation to report > or = to 40% reduction in pelvic pressure in standing  Baseline: pelvic pressure with all standing  Goal status: INITIAL    PLAN:  PT FREQUENCY: 2x/week  PT DURATION: 8 weeks  PLANNED INTERVENTIONS: Therapeutic exercises, Therapeutic activity, Neuromuscular re-education, Balance training, Gait training, Patient/Family education, Self Care, Joint mobilization, Stair training, Aquatic Therapy, Dry Needling, Spinal mobilization, Cryotherapy, Moist heat, Taping, and Manual therapy.  PLAN FOR NEXT SESSION:  core and pelvic floor activation and progress (encourage exhale on exertion), body mechanics modifications, hip and lumbar flexibility   Lorrene Reid, PT 01/05/23 10:16 AM   [facility] [address] [place], [place] [address] Phone # [phone] Fax [phone]

## 2023-01-07 ENCOUNTER — Ambulatory Visit: Payer: Medicaid Other | Admitting: Physical Therapy

## 2023-01-07 DIAGNOSIS — M6281 Muscle weakness (generalized): Secondary | ICD-10-CM

## 2023-01-07 DIAGNOSIS — M5459 Other low back pain: Secondary | ICD-10-CM

## 2023-01-07 NOTE — Therapy (Signed)
OUTPATIENT PHYSICAL THERAPY THORACOLUMBAR TREATMENT   Patient Name: Jodi Mckenzie MRN: [id number] DOB:July 03, 2000, 22 y.o., female Today's Date: 01/07/2023  END OF SESSION:  PT End of Session - 01/07/23 0932     Visit Number 4    Number of Visits 8    Date for PT Re-Evaluation 02/09/23    Authorization Type Medicaid UHC- 8 visits 7/31-9/24    PT Start Time 0932    PT Stop Time 1014    PT Time Calculation (min) 42 min    Activity Tolerance Patient tolerated treatment well              Past Medical History:  Diagnosis Date   GERD (gastroesophageal reflux disease)    Past Surgical History:  Procedure Laterality Date   NO PAST SURGERIES     Patient Active Problem List   Diagnosis Date Noted   [redacted] weeks gestation of pregnancy 12/21/2022   Cystic fibrosis carrier, 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/15/2022   Supervision of other normal pregnancy, antepartum 09/30/2022     REFERRING PROVIDER: Sharen Counter  REFERRING DIAG: O99.891,M54.9 (ICD-10-CM) - Back pain affecting pregnancy in second trimester   Rationale for Evaluation and Treatment: Rehabilitation  THERAPY DIAG:  Other low back pain  Muscle weakness (generalized)  ONSET DATE: 1 month ago (lower pelvic pain and urge incontinence)  SUBJECTIVE:                                                                                                                                                                                           SUBJECTIVE STATEMENT: I'm feeling better.  Had a little cramping on Tuesday but none since then.    PERTINENT HISTORY:  1 prior pregnancy- vacuum assistance delivery 1.5 years ago Has a 51 month old son  PAIN:  Are you having pain? no: NPRS scale: 0/10 Pain location: left low back pain Pain description: pressure, cramping Aggravating factors: standing, walking  Relieving factors: wearing belly band, sitting/supine  PRECAUTIONS: Other: Pt is pregnant   RED FLAGS: None   WEIGHT BEARING  RESTRICTIONS: No  FALLS:  Has patient fallen in last 6 months? No  LIVING ENVIRONMENT: Lives with: lives with their family Lives in: House/apartment Stairs: No Has following equipment at home: None  OCCUPATION: no- has a 46 month old child   PLOF: Independent and Leisure: none, cares for 46 month old  PATIENT GOALS: reduce pain, stand and do housework/walk in the community without limitation  NEXT MD VISIT: next week/regular check-up  OBJECTIVE:   DIAGNOSTIC FINDINGS:  None   PATIENT SURVEYS:  Modified Oswestry 6% disability   SCREENING FOR RED FLAGS: Bowel or bladder  incontinence: No Spinal tumors: No Cauda equina syndrome: No Compression fracture: No Abdominal aneurysm: No  COGNITION: Overall cognitive status: Within functional limits for tasks assessed     SENSATION: WFL  MUSCLE LENGTH: Rt hamstring limited by 20% vs the Rt  POSTURE: flexed trunk   PALPATION: Tension in bil lumbar paraspinals with mild pain reported.  No palpable pain in lower abdominals, SI joint or pubic symphysis  LUMBAR ROM:   AROM eval  Flexion Limited by 50%  Extension   Right lateral flexion Limited by 25%  Left lateral flexion Limited by 25%  Right rotation   Left rotation    (Blank rows = not tested)  LOWER EXTREMITY ROM:    Rt hip limited by 20% vs the Lt  LOWER EXTREMITY MMT:    4+/5 bil hip and knee strength, core: fair TA activation, diastasis recti with change of positions   GAIT: Distance walked: 50 Assistive device utilized: None Level of assistance: Complete Independence Comments: reduced time on Rt LE with limited trunk rotation  TODAY'S TREATMENT:    DATE: 01/07/23 NuStep L3 6 min 2nd step hip flexor stretch with UE reach up and over 10x right/left  Squat holding the stair railing 2 sets of 5  Green band rows bil 10x; single arm rows 10x 5# kettlebell overhead press 10x 5# kettlebell sit to stand 10x Holding 5# kettlebell in 1 hand with 8 inch step  taps 10x right/left Staggered stance 5# kettlebell in 1 hand reach down to opposite toes for glute activation 10x right/left  DATE: 01/05/23 Open books with bolster under leg 10x right/left Supine ab brace with mod verbal cues to avoid holding breath with ball squeeze 5" hold 2x10 Supine clam with ab brace blue loop 2x10 1/2 kneel with posterior pelvic tilt/hip flexor stretch with UE reach up and over 10x right/left Low trunk rotation 2x20 seconds  Seated hamstring stretch 3x20 seconds bil  Sitting on ball pelvic mobility all directions: anterior/posterior, side to side, circles; hip adductor stretching with lateral movements on ball Sit to stand with TA activation x15- verbal cues to control descent  Seated figure 4 3x20 seconds                                                                                                               DATE: 815/24 Discussion of use of belly bands for walking  Use of pillow b/w legs for sleeping Open books with bolster under leg 10x right/left Supine ab brace with mod verbal cues to avoid holding breath Clams 10x right /left cues to exhale as lifting leg (tends to hold breath) 1/2 kneel with posterior pelvic tilt/hip flexor stretch with UE reach up and over 10x right/left Sitting on ball pelvic mobility all directions: anterior/posterior, side to side, circles; hip adductor stretching with lateral movements on ball Pt given info on ball at her request as these felt good to perform (see pt instructions handout)     PATIENT EDUCATION:  Education details: Access Code: [email or web address] Person educated: Patient Education method:  Explanation, Demonstration, and Handouts Education comprehension: verbalized understanding and returned demonstration  HOME EXERCISE PROGRAM: Access Code: [email or web address] URL: [email or web address] Date: 01/05/2023 Prepared by: Tresa Endo  Exercises - Sidelying Open Book Thoracic Lumbar Rotation and Extension  - 2-3 x daily - 7 x  weekly - 1 sets - 10 reps - Half Kneeling Hip Flexor Stretch with Sidebend  - 2 x daily - 7 x weekly - 1 sets - 3 reps - 20 hold - Hooklying Transversus Abdominis Palpation  - 1 x daily - 7 x weekly - 3 sets - 10 reps - Seated Transversus Abdominis Bracing  - 1 x daily - 7 x weekly - 1 sets - 10 reps - Clamshell  - 2 x daily - 7 x weekly - 2 sets - 5-10 reps - Seated Hamstring Stretch  - 2 x daily - 7 x weekly - 1 sets - 3 reps - 20 hold - Seated Piriformis Stretch with Trunk Bend  - 2 x daily - 7 x weekly - 1 sets - 3 reps - 20 hold  ASSESSMENT:  CLINICAL IMPRESSION: The patient arrives without pain today and is able to advance therapeutic exercise including lumbo/pelvic mobility and general strengthening.  Verbal cues to optimize technique.  Therapist monitoring response with no pain or cramping reported throughout session.     OBJECTIVE IMPAIRMENTS: Abnormal gait, decreased activity tolerance, decreased strength, hypomobility, and pain.   ACTIVITY LIMITATIONS: lifting, standing, locomotion level, and caring for others  PARTICIPATION LIMITATIONS: meal prep, cleaning, laundry, and community activity  PERSONAL FACTORS: 1 comorbidity: 1 recent pregnancy 18 months ago  are also affecting patient's functional outcome.   REHAB POTENTIAL: Good  CLINICAL DECISION MAKING: Stable/uncomplicated  EVALUATION COMPLEXITY: Low   GOALS: Goals reviewed with patient? Yes  SHORT TERM GOALS: Target date: 01/12/2023    Be independent in initial HEP Baseline: Goal status: In progress   2.  Report > or = to 30% reduction in LBP with standing to care for her child  Baseline: 6/10 Goal status: INITIAL  3.  Stand for 1 hour for ADLS and care of child without limitation Baseline: 45 minutes  Goal status: INITIAL    LONG TERM GOALS: Target date: 02/09/2023    Be independent in advanced HEP Baseline:  Goal status: INITIAL  2.  Improve ODI to < or = to 2% disability  Baseline: 6% Goal  status: INITIAL  3.  Report > or = to 60% reduction in LBP with standing tasks  Baseline: 6/10 Goal status: INITIAL  4.  Stand for > or = to 1.5 hours for ADLs and care of her child without limitation due to pain/pressure Baseline: 45 minutes  Goal status: INITIAL  5.  Improve strength and muscular activation to report > or = to 40% reduction in pelvic pressure in standing  Baseline: pelvic pressure with all standing  Goal status: INITIAL    PLAN:  PT FREQUENCY: 2x/week  PT DURATION: 8 weeks  PLANNED INTERVENTIONS: Therapeutic exercises, Therapeutic activity, Neuromuscular re-education, Balance training, Gait training, Patient/Family education, Self Care, Joint mobilization, Stair training, Aquatic Therapy, Dry Needling, Spinal mobilization, Cryotherapy, Moist heat, Taping, and Manual therapy.  PLAN FOR NEXT SESSION:  core and pelvic floor activation and progress (encourage exhale on exertion), body mechanics modifications, hip and lumbar flexibility   Lavinia Sharps, PT 01/07/23 6:22 PM Phone: [phone] Fax: [phone] [facility] [address] [place], [place] [address] Phone # [phone] Fax [phone]

## 2023-01-12 ENCOUNTER — Ambulatory Visit: Payer: Medicaid Other

## 2023-01-12 DIAGNOSIS — M5459 Other low back pain: Secondary | ICD-10-CM

## 2023-01-12 DIAGNOSIS — M6281 Muscle weakness (generalized): Secondary | ICD-10-CM

## 2023-01-12 NOTE — Therapy (Signed)
OUTPATIENT PHYSICAL THERAPY THORACOLUMBAR TREATMENT   Patient Name: Jodi Mckenzie MRN: [id number] DOB:[birth year], 22 y.o., female Today's Date: 01/12/2023  END OF SESSION:  PT End of Session - 01/12/23 1004     Visit Number 5    Date for PT Re-Evaluation 02/09/23    Authorization Type Medicaid [employer]- 8 visits 7/31-9/24    Authorization - Visit Number 4    Authorization - Number of Visits 8    PT Start Time 0933    PT Stop Time 1002    PT Time Calculation (min) 29 min    Activity Tolerance Patient tolerated treatment well    Behavior During Therapy WFL for tasks assessed/performed               Past Medical History:  Diagnosis Date   GERD (gastroesophageal reflux disease)    Past Surgical History:  Procedure Laterality Date   NO PAST SURGERIES     Patient Active Problem List   Diagnosis Date Noted   [redacted] weeks gestation of pregnancy 12/21/2022   Cystic fibrosis carrier, 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/15/2022   Supervision of other normal pregnancy, antepartum 09/30/2022     REFERRING PROVIDER: Sharen Counter  REFERRING DIAG: O99.891,M54.9 (ICD-10-CM) - Back pain affecting pregnancy in second trimester   Rationale for Evaluation and Treatment: Rehabilitation  THERAPY DIAG:  Other low back pain  Muscle weakness (generalized)  ONSET DATE: 1 month ago (lower pelvic pain and urge incontinence)  SUBJECTIVE:                                                                                                                                                                                           SUBJECTIVE STATEMENT: I am still having pain.  Not much change since I started PT.    PERTINENT HISTORY:  1 prior pregnancy- vacuum assistance delivery 1.5 years ago Has a 4 month old son  PAIN:  Are you having pain? no: NPRS scale: 4/10 Pain location: left low back pain Pain description: pressure, cramping Aggravating factors: standing, walking  Relieving factors: wearing belly  band, sitting/supine  PRECAUTIONS: Other: Pt is pregnant   RED FLAGS: None   WEIGHT BEARING RESTRICTIONS: No  FALLS:  Has patient fallen in last 6 months? No  LIVING ENVIRONMENT: Lives with: lives with their family Lives in: House/apartment Stairs: No Has following equipment at home: None  OCCUPATION: no- has a 69 month old child   PLOF: Independent and Leisure: none, cares for 5 month old  PATIENT GOALS: reduce pain, stand and do housework/walk in the community without limitation  NEXT MD VISIT: next week/regular check-up  OBJECTIVE:   DIAGNOSTIC FINDINGS:  None   PATIENT SURVEYS:  Modified Oswestry 6% disability   SCREENING FOR RED FLAGS: Bowel or bladder incontinence: No Spinal tumors: No Cauda equina syndrome: No Compression fracture: No Abdominal aneurysm: No  COGNITION: Overall cognitive status: Within functional limits for tasks assessed     SENSATION: WFL  MUSCLE LENGTH: Rt hamstring limited by 20% vs the Rt  POSTURE: flexed trunk   PALPATION: Tension in bil lumbar paraspinals with mild pain reported.  No palpable pain in lower abdominals, SI joint or pubic symphysis  LUMBAR ROM:   AROM eval  Flexion Limited by 50%  Extension   Right lateral flexion Limited by 25%  Left lateral flexion Limited by 25%  Right rotation   Left rotation    (Blank rows = not tested)  LOWER EXTREMITY ROM:    Rt hip limited by 20% vs the Lt  LOWER EXTREMITY MMT:    4+/5 bil hip and knee strength, core: fair TA activation, diastasis recti with change of positions   GAIT: Distance walked: 50 Assistive device utilized: None Level of assistance: Complete Independence Comments: reduced time on Rt LE with limited trunk rotation  TODAY'S TREATMENT:    DATE: 01/12/23 NuStep L3 6 min 2nd step hip flexor stretch with UE reach up and over 10x right/left  Squat holding the stair railing 2 sets of 5  Green band rows bil and shoulder extension 2x10 bil each 5#  kettlebell overhead press 10x 5# kettlebell sit to stand 10x Reverse clam 2x10- more challenge on Rt Open book stretch x10  DATE: 01/07/23 NuStep L3 6 min 2nd step hip flexor stretch with UE reach up and over 10x right/left  Squat holding the stair railing 2 sets of 5  Green band rows bil 10x; single arm rows 10x 5# kettlebell overhead press 10x 5# kettlebell sit to stand 10x Holding 5# kettlebell in 1 hand with 8 inch step taps 10x right/left Staggered stance 5# kettlebell in 1 hand reach down to opposite toes for glute activation 10x right/left  DATE: 01/05/23 Open books with bolster under leg 10x right/left Supine ab brace with mod verbal cues to avoid holding breath with ball squeeze 5" hold 2x10 Supine clam with ab brace blue loop 2x10 1/2 kneel with posterior pelvic tilt/hip flexor stretch with UE reach up and over 10x right/left Low trunk rotation 2x20 seconds  Seated hamstring stretch 3x20 seconds bil  Sitting on ball pelvic mobility all directions: anterior/posterior, side to side, circles; hip adductor stretching with lateral movements on ball Sit to stand with TA activation x15- verbal cues to control descent  Seated figure 4 3x20 seconds                                                                                                               DATE: 815/24 Discussion of use of belly bands for walking  Use of pillow b/w legs for sleeping Open books with bolster under leg 10x right/left Supine ab brace with mod verbal cues to avoid holding breath Clams 10x right /  left cues to exhale as lifting leg (tends to hold breath) 1/2 kneel with posterior pelvic tilt/hip flexor stretch with UE reach up and over 10x right/left Sitting on ball pelvic mobility all directions: anterior/posterior, side to side, circles; hip adductor stretching with lateral movements on ball Pt given info on ball at her request as these felt good to perform (see pt instructions handout)     PATIENT  EDUCATION:  Education details: Access Code: [email or web address] Person educated: Patient Education method: Explanation, Demonstration, and Handouts Education comprehension: verbalized understanding and returned demonstration  HOME EXERCISE PROGRAM: Access Code: [email or web address] URL: [email or web address] Date: 01/05/2023 Prepared by: Tresa Endo  Exercises - Sidelying Open Book Thoracic Lumbar Rotation and Extension  - 2-3 x daily - 7 x weekly - 1 sets - 10 reps - Half Kneeling Hip Flexor Stretch with Sidebend  - 2 x daily - 7 x weekly - 1 sets - 3 reps - 20 hold - Hooklying Transversus Abdominis Palpation  - 1 x daily - 7 x weekly - 3 sets - 10 reps - Seated Transversus Abdominis Bracing  - 1 x daily - 7 x weekly - 1 sets - 10 reps - Clamshell  - 2 x daily - 7 x weekly - 2 sets - 5-10 reps - Seated Hamstring Stretch  - 2 x daily - 7 x weekly - 1 sets - 3 reps - 20 hold - Seated Piriformis Stretch with Trunk Bend  - 2 x daily - 7 x weekly - 1 sets - 3 reps - 20 hold  ASSESSMENT:  CLINICAL IMPRESSION: Pt arrived with some lower abdominal pain and denies any significant change in her overall symptoms since the start of care.  Pt will see MD next week to discuss.  Pt demonstrates functional hip and core weakness that could be contributing to her symptoms as her pregnancy progresses. PT monitored response with no pain or cramping reported throughout session.  Patient will benefit from skilled PT to address the below impairments and improve overall function.    OBJECTIVE IMPAIRMENTS: Abnormal gait, decreased activity tolerance, decreased strength, hypomobility, and pain.   ACTIVITY LIMITATIONS: lifting, standing, locomotion level, and caring for others  PARTICIPATION LIMITATIONS: meal prep, cleaning, laundry, and community activity  PERSONAL FACTORS: 1 comorbidity: 1 recent pregnancy 18 months ago  are also affecting patient's functional outcome.   REHAB POTENTIAL: Good  CLINICAL DECISION  MAKING: Stable/uncomplicated  EVALUATION COMPLEXITY: Low   GOALS: Goals reviewed with patient? Yes  SHORT TERM GOALS: Target date: 01/12/2023    Be independent in initial HEP Baseline: Goal status: In progress   2.  Report > or = to 30% reduction in LBP with standing to care for her child  Baseline: 6/10 Goal status: INITIAL  3.  Stand for 1 hour for ADLS and care of child without limitation Baseline: 45 minutes  Goal status: INITIAL    LONG TERM GOALS: Target date: 02/09/2023    Be independent in advanced HEP Baseline:  Goal status: INITIAL  2.  Improve ODI to < or = to 2% disability  Baseline: 6% Goal status: INITIAL  3.  Report > or = to 60% reduction in LBP with standing tasks  Baseline: 6/10 Goal status: INITIAL  4.  Stand for > or = to 1.5 hours for ADLs and care of her child without limitation due to pain/pressure Baseline: 45 minutes  Goal status: INITIAL  5.  Improve strength and muscular activation to report > or = to 40% reduction in pelvic  pressure in standing  Baseline: pelvic pressure with all standing  Goal status: INITIAL    PLAN:  PT FREQUENCY: 2x/week  PT DURATION: 8 weeks  PLANNED INTERVENTIONS: Therapeutic exercises, Therapeutic activity, Neuromuscular re-education, Balance training, Gait training, Patient/Family education, Self Care, Joint mobilization, Stair training, Aquatic Therapy, Dry Needling, Spinal mobilization, Cryotherapy, Moist heat, Taping, and Manual therapy.  PLAN FOR NEXT SESSION:  Place on hold until MD next week?    Lorrene Reid, PT 01/12/23 10:06 AM   [facility] [address], [address] [place], [place] [address] Phone # [phone] Fax [phone]

## 2023-01-14 ENCOUNTER — Ambulatory Visit: Payer: Medicaid Other | Admitting: Physical Therapy

## 2023-01-14 DIAGNOSIS — M5459 Other low back pain: Secondary | ICD-10-CM | POA: Diagnosis not present

## 2023-01-14 DIAGNOSIS — M6281 Muscle weakness (generalized): Secondary | ICD-10-CM

## 2023-01-14 NOTE — Therapy (Signed)
OUTPATIENT PHYSICAL THERAPY THORACOLUMBAR TREATMENT   Patient Name: Jodi Mckenzie MRN: [id number] DOB:11-14-2000, 22 y.o., female Today's Date: 01/14/2023  END OF SESSION:  PT End of Session - 01/14/23 0850     Visit Number 6    Number of Visits 8    Date for PT Re-Evaluation 02/09/23    Authorization Type Medicaid UHC- 8 visits 7/31-9/24    Authorization - Visit Number 6    Authorization - Number of Visits 8    PT Start Time 0848    PT Stop Time 0930    PT Time Calculation (min) 42 min    Activity Tolerance Patient tolerated treatment well               Past Medical History:  Diagnosis Date   GERD (gastroesophageal reflux disease)    Past Surgical History:  Procedure Laterality Date   NO PAST SURGERIES     Patient Active Problem List   Diagnosis Date Noted   [redacted] weeks gestation of pregnancy 12/21/2022   Cystic fibrosis carrier, 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/15/2022   Supervision of other normal pregnancy, antepartum 09/30/2022     REFERRING PROVIDER: Sharen Counter  REFERRING DIAG: O99.891,M54.9 (ICD-10-CM) - Back pain affecting pregnancy in second trimester   Rationale for Evaluation and Treatment: Rehabilitation  THERAPY DIAG:  Other low back pain  Muscle weakness (generalized)  ONSET DATE: 1 month ago (lower pelvic pain and urge incontinence)  SUBJECTIVE:                                                                                                                                                                                           SUBJECTIVE STATEMENT: My back is pretty good.  I've been using my Belly Band more for lifting my toddler and walking.   I really like moving and using the weights, I think it's good for me.  PERTINENT HISTORY:  1 prior pregnancy- vacuum assistance delivery 1.5 years ago Has a 35 month old son  PAIN:  Are you having pain? no: NPRS scale: 2/10 Pain location: left low back pain Pain description: pressure,  cramping Aggravating factors: standing, walking  Relieving factors: wearing belly band, sitting/supine  PRECAUTIONS: Other: Pt is pregnant   RED FLAGS: None   WEIGHT BEARING RESTRICTIONS: No  FALLS:  Has patient fallen in last 6 months? No  LIVING ENVIRONMENT: Lives with: lives with their family Lives in: House/apartment Stairs: No Has following equipment at home: None  OCCUPATION: no- has a 16 month old child   PLOF: Independent and Leisure: none, cares for 77 month old  PATIENT GOALS: reduce pain, stand and do housework/walk in  the community without limitation  NEXT MD VISIT: next week/regular check-up  OBJECTIVE:   DIAGNOSTIC FINDINGS:  None   PATIENT SURVEYS:  Modified Oswestry 6% disability   SCREENING FOR RED FLAGS: Bowel or bladder incontinence: No Spinal tumors: No Cauda equina syndrome: No Compression fracture: No Abdominal aneurysm: No  COGNITION: Overall cognitive status: Within functional limits for tasks assessed     SENSATION: WFL  MUSCLE LENGTH: Rt hamstring limited by 20% vs the Rt  POSTURE: flexed trunk   PALPATION: Tension in bil lumbar paraspinals with mild pain reported.  No palpable pain in lower abdominals, SI joint or pubic symphysis  LUMBAR ROM:   AROM eval  Flexion Limited by 50%  Extension   Right lateral flexion Limited by 25%  Left lateral flexion Limited by 25%  Right rotation   Left rotation    (Blank rows = not tested)  LOWER EXTREMITY ROM:    Rt hip limited by 20% vs the Lt  LOWER EXTREMITY MMT:    4+/5 bil hip and knee strength, core: fair TA activation, diastasis recti with change of positions   GAIT: Distance walked: 50 Assistive device utilized: None Level of assistance: Complete Independence Comments: reduced time on Rt LE with limited trunk rotation  TODAY'S TREATMENT:    DATE: 01/14/23 NuStep L5 7 min 2nd step hip flexor stretch with UE reach up and over 10x right/left  1/2 kneel on foam open  books 8x right/left  Floor slider hip extension and abduction 5x each right/left Stand on foam with with 5# kettlebell diagonal chops 8x right/left Sit to stand holding 5# KB with overhead press 8x Holding 5# kettlebell in 1 hand at shoulder level with 8 inch step taps 10x right/left Staggered stance 5# kettlebell in 1 hand reach down to opposite toes for glute activation 10x right/left Standing lat bar 30# 10x  DATE: 01/12/23 NuStep L3 6 min 2nd step hip flexor stretch with UE reach up and over 10x right/left  Squat holding the stair railing 2 sets of 5  Green band rows bil and shoulder extension 2x10 bil each 5# kettlebell overhead press 10x 5# kettlebell sit to stand 10x Reverse clam 2x10- more challenge on Rt Open book stretch x10  DATE: 01/07/23 NuStep L3 6 min 2nd step hip flexor stretch with UE reach up and over 10x right/left  Squat holding the stair railing 2 sets of 5  Green band rows bil 10x; single arm rows 10x 5# kettlebell overhead press 10x 5# kettlebell sit to stand 10x Holding 5# kettlebell in 1 hand with 8 inch step taps 10x right/left Staggered stance 5# kettlebell in 1 hand reach down to opposite toes for glute activation 10x right/left  DATE: 01/05/23 Open books with bolster under leg 10x right/left Supine ab brace with mod verbal cues to avoid holding breath with ball squeeze 5" hold 2x10 Supine clam with ab brace blue loop 2x10 1/2 kneel with posterior pelvic tilt/hip flexor stretch with UE reach up and over 10x right/left Low trunk rotation 2x20 seconds  Seated hamstring stretch 3x20 seconds bil  Sitting on ball pelvic mobility all directions: anterior/posterior, side to side, circles; hip adductor stretching with lateral movements on ball Sit to stand with TA activation x15- verbal cues to control descent  Seated figure 4 3x20 seconds  DATE:  815/24 Discussion of use of belly bands for walking  Use of pillow b/w legs for sleeping Open books with bolster under leg 10x right/left Supine ab brace with mod verbal cues to avoid holding breath Clams 10x right /left cues to exhale as lifting leg (tends to hold breath) 1/2 kneel with posterior pelvic tilt/hip flexor stretch with UE reach up and over 10x right/left Sitting on ball pelvic mobility all directions: anterior/posterior, side to side, circles; hip adductor stretching with lateral movements on ball Pt given info on ball at her request as these felt good to perform (see pt instructions handout)     PATIENT EDUCATION:  Education details: Access Code: [email or web address] Person educated: Patient Education method: Explanation, Demonstration, and Handouts Education comprehension: verbalized understanding and returned demonstration  HOME EXERCISE PROGRAM: Access Code: [email or web address] URL: [email or web address] Date: 01/05/2023 Prepared by: Tresa Endo  Exercises - Sidelying Open Book Thoracic Lumbar Rotation and Extension  - 2-3 x daily - 7 x weekly - 1 sets - 10 reps - Half Kneeling Hip Flexor Stretch with Sidebend  - 2 x daily - 7 x weekly - 1 sets - 3 reps - 20 hold - Hooklying Transversus Abdominis Palpation  - 1 x daily - 7 x weekly - 3 sets - 10 reps - Seated Transversus Abdominis Bracing  - 1 x daily - 7 x weekly - 1 sets - 10 reps - Clamshell  - 2 x daily - 7 x weekly - 2 sets - 5-10 reps - Seated Hamstring Stretch  - 2 x daily - 7 x weekly - 1 sets - 3 reps - 20 hold - Seated Piriformis Stretch with Trunk Bend  - 2 x daily - 7 x weekly - 1 sets - 3 reps - 20 hold  ASSESSMENT:  CLINICAL IMPRESSION: The patient is able to perform low to moderate intensity ex's with low back pain remaining very low.  She reports feeling good throughout the session and at the end of session.  Therapist monitoring response to all interventions and providing verbal cues optimize ex technique.      OBJECTIVE IMPAIRMENTS: Abnormal gait, decreased activity tolerance, decreased strength, hypomobility, and pain.   ACTIVITY LIMITATIONS: lifting, standing, locomotion level, and caring for others  PARTICIPATION LIMITATIONS: meal prep, cleaning, laundry, and community activity  PERSONAL FACTORS: 1 comorbidity: 1 recent pregnancy 18 months ago  are also affecting patient's functional outcome.   REHAB POTENTIAL: Good  CLINICAL DECISION MAKING: Stable/uncomplicated  EVALUATION COMPLEXITY: Low   GOALS: Goals reviewed with patient? Yes  SHORT TERM GOALS: Target date: 01/12/2023    Be independent in initial HEP Baseline: Goal status: In progress   2.  Report > or = to 30% reduction in LBP with standing to care for her child  Baseline: 6/10 Goal status: INITIAL  3.  Stand for 1 hour for ADLS and care of child without limitation Baseline: 45 minutes  Goal status: INITIAL    LONG TERM GOALS: Target date: 02/09/2023    Be independent in advanced HEP Baseline:  Goal status: INITIAL  2.  Improve ODI to < or = to 2% disability  Baseline: 6% Goal status: INITIAL  3.  Report > or = to 60% reduction in LBP with standing tasks  Baseline: 6/10 Goal status: INITIAL  4.  Stand for > or = to 1.5 hours for ADLs and care of her child without limitation due to pain/pressure Baseline: 45 minutes  Goal status: INITIAL  5.  Improve strength and muscular  activation to report > or = to 40% reduction in pelvic pressure in standing  Baseline: pelvic pressure with all standing  Goal status: INITIAL    PLAN:  PT FREQUENCY: 2x/week  PT DURATION: 8 weeks  PLANNED INTERVENTIONS: Therapeutic exercises, Therapeutic activity, Neuromuscular re-education, Balance training, Gait training, Patient/Family education, Self Care, Joint mobilization, Stair training, Aquatic Therapy, Dry Needling, Spinal mobilization, Cryotherapy, Moist heat, Taping, and Manual therapy.  PLAN FOR NEXT SESSION:    lumbar/pelvic/hip mobility and core ex's  Lavinia Sharps, PT 01/14/23 9:27 AM Phone: [phone] Fax: [phone]   [facility] [address] [place], [place] [address] Phone # [phone] Fax [phone]

## 2023-01-19 ENCOUNTER — Ambulatory Visit (INDEPENDENT_AMBULATORY_CARE_PROVIDER_SITE_OTHER): Payer: Medicaid Other | Admitting: Obstetrics and Gynecology

## 2023-01-19 ENCOUNTER — Other Ambulatory Visit (HOSPITAL_COMMUNITY)
Admission: RE | Admit: 2023-01-19 | Discharge: 2023-01-19 | Disposition: A | Discharge status: Home / Self Care | Payer: Medicaid Other | Source: Ambulatory Visit | Attending: Obstetrics and Gynecology | Admitting: Obstetrics and Gynecology

## 2023-01-19 VITALS — BP 111/73 | HR 78 | Wt 135.0 lb

## 2023-01-19 DIAGNOSIS — O09899 Supervision of other high risk pregnancies, unspecified trimester: Secondary | ICD-10-CM

## 2023-01-19 DIAGNOSIS — Z141 Cystic fibrosis carrier: Secondary | ICD-10-CM

## 2023-01-19 DIAGNOSIS — R109 Unspecified abdominal pain: Secondary | ICD-10-CM | POA: Insufficient documentation

## 2023-01-19 DIAGNOSIS — O26899 Other specified pregnancy related conditions, unspecified trimester: Secondary | ICD-10-CM | POA: Diagnosis present

## 2023-01-19 DIAGNOSIS — Z3480 Encounter for supervision of other normal pregnancy, unspecified trimester: Secondary | ICD-10-CM

## 2023-01-19 DIAGNOSIS — Z3482 Encounter for supervision of other normal pregnancy, second trimester: Secondary | ICD-10-CM | POA: Diagnosis not present

## 2023-01-19 DIAGNOSIS — Z3A23 23 weeks gestation of pregnancy: Secondary | ICD-10-CM

## 2023-01-19 LAB — POCT URINALYSIS DIPSTICK
Bilirubin, UA: NEGATIVE
Blood, UA: NEGATIVE
Glucose, UA: NEGATIVE
Ketones, UA: NEGATIVE
Nitrite, UA: NEGATIVE
Protein, UA: NEGATIVE
Spec Grav, UA: <=1.005 — AB (ref 1.010–1.025)
Urobilinogen, UA: 0.2 U/dL
pH, UA: 7.5 (ref 5.0–8.0)

## 2023-01-19 NOTE — Progress Notes (Signed)
Pt cmplains of cramping/braxton hicks x 1week. Pt states pain is like severe period cramps. Pt has some tenderness in right abd.

## 2023-01-21 ENCOUNTER — Ambulatory Visit: Payer: Medicaid Other | Attending: Advanced Practice Midwife | Admitting: Physical Therapy

## 2023-01-21 DIAGNOSIS — M6281 Muscle weakness (generalized): Secondary | ICD-10-CM | POA: Diagnosis present

## 2023-01-21 DIAGNOSIS — M5459 Other low back pain: Secondary | ICD-10-CM | POA: Insufficient documentation

## 2023-01-21 LAB — CERVICOVAGINAL ANCILLARY ONLY
Bacterial Vaginitis (gardnerella): NEGATIVE
Candida Glabrata: NEGATIVE
Candida Vaginitis: NEGATIVE
Chlamydia: NEGATIVE
Comment: NEGATIVE
Comment: NEGATIVE
Comment: NEGATIVE
Comment: NEGATIVE
Comment: NEGATIVE
Comment: NORMAL
Neisseria Gonorrhea: NEGATIVE
Trichomonas: NEGATIVE

## 2023-01-21 LAB — URINE CULTURE

## 2023-01-21 NOTE — Therapy (Addendum)
OUTPATIENT PHYSICAL THERAPY THORACOLUMBAR TREATMENT   Patient Name: Jodi Mckenzie MRN: [id number] DOB:2001/04/14, 22 y.o., female Today's Date: 01/21/2023  END OF SESSION:  PT End of Session - 01/21/23 0838     Visit Number 7    Number of Visits 8    Date for PT Re-Evaluation 02/09/23    Authorization Type Medicaid [employer]- 8 visits 7/31-9/24    Authorization - Visit Number 7    Authorization - Number of Visits 8    PT Start Time [phone]    PT Stop Time 0920    PT Time Calculation (min) 38 min    Activity Tolerance Patient tolerated treatment well               Past Medical History:  Diagnosis Date   GERD (gastroesophageal reflux disease)    Past Surgical History:  Procedure Laterality Date   NO PAST SURGERIES     Patient Active Problem List   Diagnosis Date Noted   [redacted] weeks gestation of pregnancy 12/21/2022   Cystic fibrosis carrier, 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/15/2022   Supervision of other normal pregnancy, antepartum 09/30/2022     REFERRING PROVIDER: Sharen Counter  REFERRING DIAG: O99.891,M54.9 (ICD-10-CM) - Back pain affecting pregnancy in second trimester   Rationale for Evaluation and Treatment: Rehabilitation  THERAPY DIAG:  Other low back pain  Muscle weakness (generalized)  ONSET DATE: 1 month ago (lower pelvic pain and urge incontinence)  SUBJECTIVE:                                                                                                                                                                                           SUBJECTIVE STATEMENT: Doing OK today and did well after last visit.  I told my OB about the Deberah Pelton and she could feel it yesterday at my appt.  She ran some tests and everything was OK.  I run a lot at home training my dog and taking care of my son.  Some days it's better then other days not.   But after my PT appts I feel good the rest of the day.   [redacted] weeks pregnant  PERTINENT HISTORY:  1 prior pregnancy- vacuum  assistance delivery 1.5 years ago Has a 15 month old son  PAIN:  Are you having pain? no: NPRS scale: 0/10 Pain location: left low back pain Pain description: pressure, cramping Aggravating factors: standing, walking  Relieving factors: wearing belly band, sitting/supine  PRECAUTIONS: Other: Pt is pregnant   RED FLAGS: None   WEIGHT BEARING RESTRICTIONS: No  FALLS:  Has patient fallen in last 6 months? No  LIVING ENVIRONMENT: Lives with:  lives with their family Lives in: House/apartment Stairs: No Has following equipment at home: None  OCCUPATION: no- has a 19 month old child   PLOF: Independent and Leisure: none, cares for 49 month old  PATIENT GOALS: reduce pain, stand and do housework/walk in the community without limitation  NEXT MD VISIT: next week/regular check-up  OBJECTIVE:   DIAGNOSTIC FINDINGS:  None   PATIENT SURVEYS:  Modified Oswestry 6% disability   SCREENING FOR RED FLAGS: Bowel or bladder incontinence: No Spinal tumors: No Cauda equina syndrome: No Compression fracture: No Abdominal aneurysm: No  COGNITION: Overall cognitive status: Within functional limits for tasks assessed     SENSATION: WFL  MUSCLE LENGTH: Rt hamstring limited by 20% vs the Rt  POSTURE: flexed trunk   PALPATION: Tension in bil lumbar paraspinals with mild pain reported.  No palpable pain in lower abdominals, SI joint or pubic symphysis  LUMBAR ROM:   AROM eval  Flexion Limited by 50%  Extension   Right lateral flexion Limited by 25%  Left lateral flexion Limited by 25%  Right rotation   Left rotation    (Blank rows = not tested)  LOWER EXTREMITY ROM:    Rt hip limited by 20% vs the Lt  LOWER EXTREMITY MMT:    4+/5 bil hip and knee strength, core: fair TA activation, diastasis recti with change of positions   GAIT: Distance walked: 50 Assistive device utilized: None Level of assistance: Complete Independence Comments: reduced time on Rt LE with  limited trunk rotation  TODAY'S TREATMENT:    DATE: 01/21/23 NuStep blue machine L3 7 min 2nd step hip flexor stretch with UE reach up and over 8x right/left  2nd step with trunk rotation 8x 1/2 kneel blue band diagonal extensions 10x right/left  7# dumbbells farmer's carry with step taps 2 sets of 10 Staggered stance  Floor slider arcs 8x each right/left (has a spot on abdomen that is painful, better with smaller arc) Dead lift sliding 2  7# dumbbells to knee level 10x (pt states I like this one) Sit to stand holding 7# weight with overhead press 10x Staggered stance with dip down blue band rows 5x right/left ( difficult) Standing lat bar 30# 10x  DATE: 01/14/23 NuStep L5 7 min 2nd step hip flexor stretch with UE reach up and over 10x right/left  1/2 kneel on foam open books 8x right/left  Floor slider hip extension and abduction 5x each right/left Stand on foam with with 5# kettlebell diagonal chops 8x right/left Sit to stand holding 5# KB with overhead press 8x Holding 5# kettlebell in 1 hand at shoulder level with 8 inch step taps 10x right/left Staggered stance 5# kettlebell in 1 hand reach down to opposite toes for glute activation 10x right/left Standing lat bar 30# 10x  DATE: 01/12/23 NuStep L3 6 min 2nd step hip flexor stretch with UE reach up and over 10x right/left  Squat holding the stair railing 2 sets of 5  Green band rows bil and shoulder extension 2x10 bil each 5# kettlebell overhead press 10x 5# kettlebell sit to stand 10x Reverse clam 2x10- more challenge on Rt Open book stretch x10  DATE: 01/07/23 NuStep L3 6 min 2nd step hip flexor stretch with UE reach up and over 10x right/left  Squat holding the stair railing 2 sets of 5  Green band rows bil 10x; single arm rows 10x 5# kettlebell overhead press 10x 5# kettlebell sit to stand 10x Holding 5# kettlebell in 1 hand with  8 inch step taps 10x right/left Staggered stance 5# kettlebell in 1 hand reach down to  opposite toes for glute activation 10x right/left  DATE: 01/05/23 Open books with bolster under leg 10x right/left Supine ab brace with mod verbal cues to avoid holding breath with ball squeeze 5" hold 2x10 Supine clam with ab brace blue loop 2x10 1/2 kneel with posterior pelvic tilt/hip flexor stretch with UE reach up and over 10x right/left Low trunk rotation 2x20 seconds  Seated hamstring stretch 3x20 seconds bil  Sitting on ball pelvic mobility all directions: anterior/posterior, side to side, circles; hip adductor stretching with lateral movements on ball Sit to stand with TA activation x15- verbal cues to control descent  Seated figure 4 3x20 seconds                PATIENT EDUCATION:  Education details: Access Code: [email or web address] Person educated: Patient Education method: Explanation, Demonstration, and Handouts Education comprehension: verbalized understanding and returned demonstration  HOME EXERCISE PROGRAM: Access Code: [email or web address] URL: [email or web address] Date: 01/05/2023 Prepared by: Tresa Endo  Exercises - Sidelying Open Book Thoracic Lumbar Rotation and Extension  - 2-3 x daily - 7 x weekly - 1 sets - 10 reps - Half Kneeling Hip Flexor Stretch with Sidebend  - 2 x daily - 7 x weekly - 1 sets - 3 reps - 20 hold - Hooklying Transversus Abdominis Palpation  - 1 x daily - 7 x weekly - 3 sets - 10 reps - Seated Transversus Abdominis Bracing  - 1 x daily - 7 x weekly - 1 sets - 10 reps - Clamshell  - 2 x daily - 7 x weekly - 2 sets - 5-10 reps - Seated Hamstring Stretch  - 2 x daily - 7 x weekly - 1 sets - 3 reps - 20 hold - Seated Piriformis Stretch with Trunk Bend  - 2 x daily - 7 x weekly - 1 sets - 3 reps - 20 hold  ASSESSMENT:  CLINICAL IMPRESSION: Pt states her pain is variable, better one day and feeling it the next.  She does not have back pain today but reports a spot on the right side of her abdomen that is moderately painful.  She discussed this with her OB  yesterday who was unable to determine the cause.  Therapist monitoring response to all exercise and providing verbal cues to optimize technique.     OBJECTIVE IMPAIRMENTS: Abnormal gait, decreased activity tolerance, decreased strength, hypomobility, and pain.   ACTIVITY LIMITATIONS: lifting, standing, locomotion level, and caring for others  PARTICIPATION LIMITATIONS: meal prep, cleaning, laundry, and community activity  PERSONAL FACTORS: 1 comorbidity: 1 recent pregnancy 18 months ago  are also affecting patient's functional outcome.   REHAB POTENTIAL: Good  CLINICAL DECISION MAKING: Stable/uncomplicated  EVALUATION COMPLEXITY: Low   GOALS: Goals reviewed with patient? Yes  SHORT TERM GOALS: Target date: 01/12/2023    Be independent in initial HEP Baseline: Goal status: met 9/5   2.  Report > or = to 30% reduction in LBP with standing to care for her child  Baseline: 6/10 Goal status: VARIABLE- partially met 9/5  3.  Stand for 1 hour for ADLS and care of child without limitation Baseline: 45 minutes  Goal status: met  9/5    LONG TERM GOALS: Target date: 02/09/2023    Be independent in advanced HEP Baseline:  Goal status: INITIAL  2.  Improve ODI to < or = to 2% disability  Baseline: 6% Goal status: INITIAL  3.  Report > or = to 60% reduction in LBP with standing tasks  Baseline: 6/10 Goal status: INITIAL  4.  Stand for > or = to 1.5 hours for ADLs and care of her child without limitation due to pain/pressure Baseline: 45 minutes  Goal status: INITIAL  5.  Improve strength and muscular activation to report > or = to 40% reduction in pelvic pressure in standing  Baseline: pelvic pressure with all standing  Goal status: INITIAL    PLAN:  PT FREQUENCY: 2x/week  PT DURATION: 8 weeks  PLANNED INTERVENTIONS: Therapeutic exercises, Therapeutic activity, Neuromuscular re-education, Balance training, Gait training, Patient/Family education, Self Care,  Joint mobilization, Stair training, Aquatic Therapy, Dry Needling, Spinal mobilization, Cryotherapy, Moist heat, Taping, and Manual therapy.  PLAN FOR NEXT SESSION:   next visit is last visit approved--determine if further visits should be requested vs discharge; lumbar/pelvic/hip mobility and core ex's    Lavinia Sharps, PT 01/21/23 9:29 AM Phone: [phone] Fax: [phone] PHYSICAL THERAPY DISCHARGE SUMMARY  Visits from Start of Care: 7  Current functional level related to goals / functional outcomes: Pt didn't return to PT.  She has had 3 consecutive no-show appts and will be discharged from PT.     Remaining deficits: See above for most current status.    Education / Equipment: HEP, Estate manager/land agent    Patient agrees to discharge. Patient goals were partially met. Patient is being discharged due to not returning since the last visit.  Lorrene Reid, PT 02/02/23 9:48 AM   [facility] [address] [place], [place] [address] Phone # [phone] Fax [phone]

## 2023-01-22 NOTE — Progress Notes (Signed)
   PRENATAL VISIT NOTE  Subjective:  Jodi Mckenzie is a 22 y.o. G2P1001 at [redacted]w[redacted]d being seen today for ongoing prenatal care.  She is currently monitored for the following issues for this low-risk pregnancy and has Supervision of other normal pregnancy, antepartum; Cystic fibrosis carrier, antepartum; and [redacted] weeks gestation of pregnancy on their problem list.  Patient reports  abdominal cramping .  Contractions: Irritability. Vag. Bleeding: None.  Movement: Present. Denies leaking of fluid.   The following portions of the patient's history were reviewed and updated as appropriate: allergies, current medications, past family history, past medical history, past social history, past surgical history and problem list.   Objective:   Vitals:   01/19/23 1035  BP: 111/73  Pulse: 78  Weight: 135 lb (61.2 kg)    Fetal Status: Fetal Heart Rate (bpm): 145   Movement: Present     General:  Alert, oriented and cooperative. Patient is in no acute distress.  Skin: Skin is warm and dry. No rash noted.   Cardiovascular: Normal heart rate noted  Respiratory: Normal respiratory effort, no problems with respiration noted  Abdomen: Soft, gravid, appropriate for gestational age.  Pain/Pressure: Present     Exam performed in presence of chaperone - SSE no abnormal discharge, cervix visually closed.   Assessment and Plan:  Pregnancy: G2P1001 at [redacted]w[redacted]d 1. Supervision of other normal pregnancy, antepartum 2. [redacted] weeks gestation of pregnancy Discussed tdap, /3\ labs and 2h GTT w/ need for fasting next appt  3. Cystic fibrosis carrier, antepartum S/p genetic counseling Partner has not completed test kit yet  4. Abdominal pain affecting pregnancy SSE normal with visually closed cervix. Urine dip w/out evidence of UTI - POCT Urinalysis Dipstick - Urine Culture - Cervicovaginal ancillary only( [place])  Preterm labor symptoms and general obstetric precautions including but not limited to vaginal  bleeding, contractions, leaking of fluid and fetal movement were reviewed in detail with the patient. Please refer to After Visit Summary for other counseling recommendations.   Return in about 4 weeks (around 02/16/2023) for return OB at 28 weeks with tdap, 2h GTT, CBC/HIV/RPR.  Future Appointments  Date Time Provider Department Center  01/26/2023  9:30 AM Edrick Oh, PT [facility] None  01/28/2023  8:00 AM Lavinia Sharps C, PT [facility] None  02/02/2023  9:30 AM Edrick Oh, PT [facility] None  02/04/2023  9:30 AM Edrick Oh, PT [facility] None  02/09/2023  9:30 AM Edrick Oh, PT [facility] None  02/16/2023  8:55 AM Lennart Pall, MD [facility] None   Lennart Pall, MD

## 2023-01-26 ENCOUNTER — Ambulatory Visit: Payer: Medicaid Other

## 2023-01-26 ENCOUNTER — Telehealth: Payer: Self-pay

## 2023-01-26 NOTE — Telephone Encounter (Signed)
PT called pt due to no-show appt today.  Left voicemail.

## 2023-01-28 ENCOUNTER — Ambulatory Visit: Payer: Medicaid Other | Admitting: Physical Therapy

## 2023-01-28 ENCOUNTER — Telehealth: Payer: Self-pay | Admitting: Physical Therapy

## 2023-01-28 NOTE — Telephone Encounter (Signed)
Left voicemail message to call facility.  2nd no-show

## 2023-02-16 ENCOUNTER — Other Ambulatory Visit: Payer: Medicaid Other

## 2023-02-16 ENCOUNTER — Ambulatory Visit (INDEPENDENT_AMBULATORY_CARE_PROVIDER_SITE_OTHER): Payer: Medicaid Other | Admitting: Obstetrics and Gynecology

## 2023-02-16 VITALS — BP 115/74 | HR 98 | Wt 141.0 lb

## 2023-02-16 DIAGNOSIS — Z3A27 27 weeks gestation of pregnancy: Secondary | ICD-10-CM | POA: Diagnosis not present

## 2023-02-16 DIAGNOSIS — O09892 Supervision of other high risk pregnancies, second trimester: Secondary | ICD-10-CM | POA: Diagnosis not present

## 2023-02-16 DIAGNOSIS — Z23 Encounter for immunization: Secondary | ICD-10-CM

## 2023-02-16 DIAGNOSIS — O09899 Supervision of other high risk pregnancies, unspecified trimester: Secondary | ICD-10-CM

## 2023-02-16 DIAGNOSIS — Z3480 Encounter for supervision of other normal pregnancy, unspecified trimester: Secondary | ICD-10-CM

## 2023-02-16 DIAGNOSIS — Z141 Cystic fibrosis carrier: Secondary | ICD-10-CM

## 2023-02-16 NOTE — Progress Notes (Signed)
Pt is doing 2GTT today.  Pt has had trouble sleeping, discuss recommendations.

## 2023-02-16 NOTE — Progress Notes (Signed)
   PRENATAL VISIT NOTE  Subjective:  Jodi Mckenzie is a 22 y.o. G2P1001 at [redacted]w[redacted]d being seen today for ongoing prenatal care.  She is currently monitored for the following issues for this low-risk pregnancy and has Supervision of other normal pregnancy, antepartum and Cystic fibrosis carrier, antepartum on their problem list.  Patient reports  insomnia .  Contractions: Not present. Vag. Bleeding: None.  Movement: Present. Denies leaking of fluid.   The following portions of the patient's history were reviewed and updated as appropriate: allergies, current medications, past family history, past medical history, past social history, past surgical history and problem list.   Objective:   Vitals:   02/16/23 0855  BP: 115/74  Pulse: 98  Weight: 141 lb (64 kg)   Fetal Status: Fetal Heart Rate (bpm): 150 Fundal Height: 27 cm Movement: Present     General:  Alert, oriented and cooperative. Patient is in no acute distress.  Skin: Skin is warm and dry. No rash noted.   Cardiovascular: Normal heart rate noted  Respiratory: Normal respiratory effort, no problems with respiration noted  Abdomen: Soft, gravid, appropriate for gestational age.  Pain/Pressure: Absent      Assessment and Plan:  Pregnancy: G2P1001 at [redacted]w[redacted]d 1. Supervision of other normal pregnancy, antepartum 2. [redacted] weeks gestation of pregnancy Tdap today Discussed sleep hygiene/strategies to help with insomnia - Glucose Tolerance, 2 Hours w/1 Hour - RPR - CBC - HIV antibody (with reflex) - Flu vaccine trivalent PF, 6mos and older(Flulaval,Afluria,Fluarix,Fluzone)  3. Cystic fibrosis carrier, antepartum FOB has not sent kit in  Please refer to After Visit Summary for other counseling recommendations.   Return in about 2 weeks (around 03/02/2023) for return OB at 29-30 weeks.  Future Appointments  Date Time Provider Department Center  03/03/2023  9:35 AM Milas Hock, MD [facility] None  03/17/2023 11:15 AM Sue Lush, FNP [facility] None  03/31/2023  9:35 AM Conan Bowens, MD [facility] None  04/13/2023  9:35 AM Leftwich-Kirby, Wilmer Floor, CNM [facility] None   Lennart Pall, MD

## 2023-02-17 LAB — CBC
Hematocrit: 37.7 % (ref 34.0–46.6)
Hemoglobin: 12.3 g/dL (ref 11.1–15.9)
MCH: 28.7 pg (ref 26.6–33.0)
MCHC: 32.6 g/dL (ref 31.5–35.7)
MCV: 88 fL (ref 79–97)
Platelets: 202 10*3/uL (ref 150–450)
RBC: 4.29 x10E6/uL (ref 3.77–5.28)
RDW: 12.4 % (ref 11.7–15.4)
WBC: 13.3 10*3/uL — ABNORMAL HIGH (ref 3.4–10.8)

## 2023-02-17 LAB — HIV ANTIBODY (ROUTINE TESTING W REFLEX): HIV Screen 4th Generation wRfx: NONREACTIVE

## 2023-02-17 LAB — GLUCOSE TOLERANCE, 2 HOURS W/ 1HR
Glucose, 1 hour: 158 mg/dL (ref 70–179)
Glucose, 2 hour: 111 mg/dL (ref 70–152)
Glucose, Fasting: 72 mg/dL (ref 70–91)

## 2023-02-17 LAB — RPR: RPR Ser Ql: NONREACTIVE

## 2023-03-03 ENCOUNTER — Ambulatory Visit: Payer: Medicaid Other | Admitting: Obstetrics and Gynecology

## 2023-03-03 VITALS — BP 115/74 | HR 108 | Wt 144.0 lb

## 2023-03-03 DIAGNOSIS — Z1339 Encounter for screening examination for other mental health and behavioral disorders: Secondary | ICD-10-CM

## 2023-03-03 DIAGNOSIS — O09893 Supervision of other high risk pregnancies, third trimester: Secondary | ICD-10-CM

## 2023-03-03 DIAGNOSIS — Z3480 Encounter for supervision of other normal pregnancy, unspecified trimester: Secondary | ICD-10-CM

## 2023-03-03 DIAGNOSIS — Z141 Cystic fibrosis carrier: Secondary | ICD-10-CM

## 2023-03-03 DIAGNOSIS — Z3A29 29 weeks gestation of pregnancy: Secondary | ICD-10-CM

## 2023-03-03 MED ORDER — FAMOTIDINE 20 MG PO TABS
20.0000 mg | ORAL_TABLET | Freq: Two times a day (BID) | ORAL | 3 refills | Status: DC
Start: 2023-03-03 — End: 2023-05-27

## 2023-03-03 NOTE — Progress Notes (Signed)
   PRENATAL VISIT NOTE  Subjective:  Jodi Mckenzie is a 22 y.o. G2P1001 at [redacted]w[redacted]d being seen today for ongoing prenatal care.  She is currently monitored for the following issues for this low-risk pregnancy and has Supervision of other normal pregnancy, antepartum; Cystic fibrosis carrier, antepartum; and Postural tremor on their problem list.  Patient reports nausea.  Contractions: Not present. Vag. Bleeding: None.  Movement: Present. Denies leaking of fluid.   The following portions of the patient's history were reviewed and updated as appropriate: allergies, current medications, past family history, past medical history, past social history, past surgical history and problem list.   Objective:   Vitals:   03/03/23 0938  BP: 115/74  Pulse: (!) 108  Weight: 144 lb (65.3 kg)    Fetal Status: Fetal Heart Rate (bpm): 147 Fundal Height: 30 cm Movement: Present     General:  Alert, oriented and cooperative. Patient is in no acute distress.  Skin: Skin is warm and dry. No rash noted.   Cardiovascular: Normal heart rate noted  Respiratory: Normal respiratory effort, no problems with respiration noted  Abdomen: Soft, gravid, appropriate for gestational age.  Pain/Pressure: Absent     Pelvic: Cervical exam deferred        Extremities: Normal range of motion.  Edema: None  Mental Status: Normal mood and affect. Normal behavior. Normal judgment and thought content.   Assessment and Plan:  Pregnancy: G2P1001 at [redacted]w[redacted]d 1. Supervision of other normal pregnancy, antepartum Doing well. FH normal, BP wnl.  Nausea likely GERD related. Will try pepcid.   2. Pregnancy with 29 completed weeks gestation  3. Cystic fibrosis carrier, antepartum   Preterm labor symptoms and general obstetric precautions including but not limited to vaginal bleeding, contractions, leaking of fluid and fetal movement were reviewed in detail with the patient. Please refer to After Visit Summary for other counseling  recommendations.   Return in about 2 weeks (around 03/17/2023) for OB VISIT, MD or APP.  Future Appointments  Date Time Provider Department Center  03/17/2023 11:15 AM Sue Lush, FNP [facility] None  03/31/2023  9:35 AM Conan Bowens, MD [facility] None  04/13/2023  9:35 AM Leftwich-Kirby, Wilmer Floor, CNM [facility] None    Milas Hock, MD

## 2023-03-17 ENCOUNTER — Ambulatory Visit (INDEPENDENT_AMBULATORY_CARE_PROVIDER_SITE_OTHER): Payer: Medicaid Other | Admitting: Obstetrics and Gynecology

## 2023-03-17 VITALS — BP 117/74 | HR 94 | Wt 144.8 lb

## 2023-03-17 DIAGNOSIS — Z3480 Encounter for supervision of other normal pregnancy, unspecified trimester: Secondary | ICD-10-CM

## 2023-03-17 DIAGNOSIS — Z141 Cystic fibrosis carrier: Secondary | ICD-10-CM

## 2023-03-17 DIAGNOSIS — Z3A31 31 weeks gestation of pregnancy: Secondary | ICD-10-CM

## 2023-03-17 NOTE — Progress Notes (Signed)
Pt. Presents for Rob. Pt. Complains of light spotting on Sunday 03/14/23 and on Thursday 03/11/23. None since 03/14/23

## 2023-03-17 NOTE — Progress Notes (Signed)
   PRENATAL VISIT NOTE  Subjective:  Jodi Mckenzie is a 22 y.o. G2P1001 at [redacted]w[redacted]d being seen today for ongoing prenatal care.  She is currently monitored for the following issues for this low-risk pregnancy and has Supervision of other normal pregnancy, antepartum; Cystic fibrosis carrier, antepartum; and Postural tremor on their problem list.  Patient reports no complaints. Had episode of spotting on 10/24 and 10/27, none since then, denies vaginal or urinary complaints  Contractions: Not present.  .  Movement: Present. Denies leaking of fluid.   The following portions of the patient's history were reviewed and updated as appropriate: allergies, current medications, past family history, past medical history, past social history, past surgical history and problem list.   Objective:   Vitals:   03/17/23 1051  BP: 117/74  Pulse: 94  Weight: 144 lb 12.8 oz (65.7 kg)    Fetal Status: Fetal Heart Rate (bpm): 149 Fundal Height: 31 cm Movement: Present     General:  Alert, oriented and cooperative. Patient is in no acute distress.  Skin: Skin is warm and dry. No rash noted.   Cardiovascular: Normal heart rate noted  Respiratory: Normal respiratory effort, no problems with respiration noted  Abdomen: Soft, gravid, appropriate for gestational age.  Pain/Pressure: Absent     Pelvic: Cervical exam deferred        Extremities: Normal range of motion.  Edema: None  Mental Status: Normal mood and affect. Normal behavior. Normal judgment and thought content.   Assessment and Plan:  Pregnancy: G2P1001 at [redacted]w[redacted]d 1. Supervision of other normal pregnancy, antepartum BP and FHR normal Feeling regular fetal movement FH appropriate  2. [redacted] weeks gestation of pregnancy Scheduled for waterbirth class 11/12, discussed sending certificate after. Will meet with midwife in two appts.   Precautions discussed regarding spotting/ bleeding   3. Cystic fibrosis carrier    Preterm labor symptoms and  general obstetric precautions including but not limited to vaginal bleeding, contractions, leaking of fluid and fetal movement were reviewed in detail with the patient. Please refer to After Visit Summary for other counseling recommendations.   No follow-ups on file.  Future Appointments  Date Time Provider Department Center  03/31/2023  9:35 AM Conan Bowens, MD [facility] None  04/13/2023  9:35 AM Leftwich-Kirby, Wilmer Floor, CNM [facility] None    Albertine Grates, FNP

## 2023-03-31 ENCOUNTER — Other Ambulatory Visit (HOSPITAL_COMMUNITY)
Admission: RE | Admit: 2023-03-31 | Discharge: 2023-03-31 | Disposition: A | Discharge status: Home / Self Care | Payer: Medicaid Other | Source: Ambulatory Visit | Attending: Obstetrics and Gynecology | Admitting: Obstetrics and Gynecology

## 2023-03-31 ENCOUNTER — Ambulatory Visit (INDEPENDENT_AMBULATORY_CARE_PROVIDER_SITE_OTHER): Payer: Medicaid Other | Admitting: Obstetrics and Gynecology

## 2023-03-31 VITALS — BP 127/82 | HR 101 | Wt 147.8 lb

## 2023-03-31 DIAGNOSIS — R12 Heartburn: Secondary | ICD-10-CM

## 2023-03-31 DIAGNOSIS — Z3A33 33 weeks gestation of pregnancy: Secondary | ICD-10-CM

## 2023-03-31 DIAGNOSIS — N939 Abnormal uterine and vaginal bleeding, unspecified: Secondary | ICD-10-CM | POA: Insufficient documentation

## 2023-03-31 DIAGNOSIS — G252 Other specified forms of tremor: Secondary | ICD-10-CM

## 2023-03-31 DIAGNOSIS — Z3480 Encounter for supervision of other normal pregnancy, unspecified trimester: Secondary | ICD-10-CM

## 2023-03-31 DIAGNOSIS — O09899 Supervision of other high risk pregnancies, unspecified trimester: Secondary | ICD-10-CM

## 2023-03-31 DIAGNOSIS — Z141 Cystic fibrosis carrier: Secondary | ICD-10-CM

## 2023-03-31 NOTE — Progress Notes (Signed)
   PRENATAL VISIT NOTE  Subjective:  Jodi Mckenzie is a 22 y.o. G2P1001 at [redacted]w[redacted]d being seen today for ongoing prenatal care.  She is currently monitored for the following issues for this low-risk pregnancy and has Supervision of other normal pregnancy, antepartum; Cystic fibrosis carrier, antepartum; and Postural tremor on their problem list.  Patient reports  some chest pain. Reports it started last night, she has a 4/10 sharp pain in top of stomach area and upper back, comes and goes .  Was worse last night, she took a pepcid but that didn't help because she thought it was heartburn. Took a tums last last night and that helped and was able to go to sleep. It is improved this morning and less frequent. Denies racing heartbeat, feeling light-headed or dizzy. Contractions: Not present. Vag. Bleeding: Other (Very light pink spotting).  Movement: Present. Denies leaking of fluid.   Spotting 1-2x per week, very light and very small amount.   The following portions of the patient's history were reviewed and updated as appropriate: allergies, current medications, past family history, past medical history, past social history, past surgical history and problem list.   Objective:   Vitals:   03/31/23 0949  BP: 127/82  Pulse: (!) 101  Weight: 147 lb 12.8 oz (67 kg)    Fetal Status: Fetal Heart Rate (bpm): 143   Movement: Present     General:  Alert, oriented and cooperative. Patient is in no acute distress.  Skin: Skin is warm and dry. No rash noted.   Cardiovascular: Normal heart rate noted  Respiratory: Normal respiratory effort, no problems with respiration noted  Abdomen: Soft, gravid, appropriate for gestational age.  Pain/Pressure: Absent     Pelvic: Cervical exam deferred      no bleeding noted on speculum exam  Extremities: Normal range of motion.  Edema: None  Mental Status: Normal mood and affect. Normal behavior. Normal judgment and thought content.   Assessment and Plan:   Pregnancy: G2P1001 at [redacted]w[redacted]d  1. Supervision of other normal pregnancy, antepartum  2. Cystic fibrosis carrier, antepartum  3. Postural tremor  4. [redacted] weeks gestation of pregnancy  5. Vaginal spotting Vaginal swab  6. Heartburn Chest pain last night improved with tums and better this am, rec she take pepcid daily, tums prn, and gave precautions to present to ED if pain is bad - also stay upright after meals x 2 hrs   Preterm labor symptoms and general obstetric precautions including but not limited to vaginal bleeding, contractions, leaking of fluid and fetal movement were reviewed in detail with the patient. Please refer to After Visit Summary for other counseling recommendations.   No follow-ups on file.  Future Appointments  Date Time Provider Department Center  04/13/2023  9:35 AM Leftwich-Kirby, Wilmer Floor, CNM [facility] None    Conan Bowens, MD

## 2023-04-01 LAB — CERVICOVAGINAL ANCILLARY ONLY
Bacterial Vaginitis (gardnerella): NEGATIVE
Candida Glabrata: NEGATIVE
Candida Vaginitis: NEGATIVE
Chlamydia: NEGATIVE
Comment: NEGATIVE
Comment: NEGATIVE
Comment: NEGATIVE
Comment: NEGATIVE
Comment: NEGATIVE
Comment: NORMAL
Neisseria Gonorrhea: NEGATIVE
Trichomonas: NEGATIVE

## 2023-04-13 ENCOUNTER — Ambulatory Visit (INDEPENDENT_AMBULATORY_CARE_PROVIDER_SITE_OTHER): Payer: Medicaid Other | Admitting: Advanced Practice Midwife

## 2023-04-13 ENCOUNTER — Encounter: Payer: Self-pay | Admitting: Advanced Practice Midwife

## 2023-04-13 VITALS — BP 115/77 | HR 105 | Wt 149.0 lb

## 2023-04-13 DIAGNOSIS — Z3A35 35 weeks gestation of pregnancy: Secondary | ICD-10-CM

## 2023-04-13 DIAGNOSIS — R000 Tachycardia, unspecified: Secondary | ICD-10-CM

## 2023-04-13 DIAGNOSIS — R42 Dizziness and giddiness: Secondary | ICD-10-CM

## 2023-04-13 DIAGNOSIS — Z3480 Encounter for supervision of other normal pregnancy, unspecified trimester: Secondary | ICD-10-CM

## 2023-04-13 NOTE — Progress Notes (Signed)
   PRENATAL VISIT NOTE  Subjective:  Jodi Mckenzie is a 22 y.o. G2P1001 at [redacted]w[redacted]d being seen today for ongoing prenatal care.  She is currently monitored for the following issues for this low-risk pregnancy and has Supervision of other normal pregnancy, antepartum; Cystic fibrosis carrier, antepartum; and Postural tremor on their problem list.  Patient reports  tachycardia with dizziness, especially when standing for long periods, improves with rest .  Contractions: Irregular. Vag. Bleeding: None.  Movement: Present. Denies leaking of fluid.   The following portions of the patient's history were reviewed and updated as appropriate: allergies, current medications, past family history, past medical history, past social history, past surgical history and problem list.   Objective:   Vitals:   04/13/23 1008  BP: 115/77  Pulse: (!) 105  Weight: 149 lb (67.6 kg)        Orthostatic vitals Laying  BP: 117/84 HR: 123   Sitting BP: 117/78 HR: 103   Standing  BP: 117/83 HR: 115   Standing 2 mins BP: 127/86 HR: 110      Fetal Status: Fetal Heart Rate (bpm): 154 Fundal Height: 34 cm Movement: Present     General:  Alert, oriented and cooperative. Patient is in no acute distress.  Skin: Skin is warm and dry. No rash noted.   Cardiovascular: Normal heart rate noted  Respiratory: Normal respiratory effort, no problems with respiration noted  Abdomen: Soft, gravid, appropriate for gestational age.  Pain/Pressure: Present     Pelvic: Cervical exam deferred        Extremities: Normal range of motion.  Edema: Trace  Mental Status: Normal mood and affect. Normal behavior. Normal judgment and thought content.   Assessment and Plan:  Pregnancy: G2P1001 at [redacted]w[redacted]d 1. Supervision of other normal pregnancy, antepartum --Anticipatory guidance about next visits/weeks of pregnancy given.  --No barriers to WB at this time, pt has taken class, to send via MyChart  2. [redacted] weeks gestation of  pregnancy   3. Dizziness  - AMB Referral to Cardio Obstetrics  4. Tachycardia with heart rate 100-120 beats per minute --May be physiologic r/t pregnancy but given pt has symptoms, will evaluate further --Orthostatic VS today  - AMB Referral to Cardio Obstetrics   Preterm labor symptoms and general obstetric precautions including but not limited to vaginal bleeding, contractions, leaking of fluid and fetal movement were reviewed in detail with the patient. Please refer to After Visit Summary for other counseling recommendations.   Return in about 2 weeks (around 04/27/2023) for Midwife preferred.  Future Appointments  Date Time Provider Department Center  04/28/2023  1:30 PM Gerrit Heck, CNM [facility] None  05/07/2023  9:35 AM Gerrit Heck, CNM [facility] None     Sharen Counter, CNM

## 2023-04-13 NOTE — Progress Notes (Signed)
Rob visit. No concerns.   Orthostatic vitals Laying  BP: 117/84 HR: 123  Sitting BP: 117/78 HR: 103  Standing  BP: 117/83 HR: 115  Standing 2 mins BP: 127/86 HR: 110

## 2023-04-17 ENCOUNTER — Inpatient Hospital Stay (HOSPITAL_COMMUNITY)
Admission: AD | Admit: 2023-04-17 | Discharge: 2023-04-17 | Disposition: A | Discharge status: Home / Self Care | Payer: Medicaid Other | Attending: Obstetrics & Gynecology | Admitting: Obstetrics & Gynecology

## 2023-04-17 ENCOUNTER — Encounter (HOSPITAL_COMMUNITY): Payer: Self-pay | Admitting: Obstetrics & Gynecology

## 2023-04-17 ENCOUNTER — Encounter: Payer: Self-pay | Admitting: Advanced Practice Midwife

## 2023-04-17 DIAGNOSIS — R000 Tachycardia, unspecified: Secondary | ICD-10-CM | POA: Insufficient documentation

## 2023-04-17 DIAGNOSIS — O163 Unspecified maternal hypertension, third trimester: Secondary | ICD-10-CM | POA: Diagnosis not present

## 2023-04-17 DIAGNOSIS — Z3A36 36 weeks gestation of pregnancy: Secondary | ICD-10-CM | POA: Insufficient documentation

## 2023-04-17 DIAGNOSIS — R030 Elevated blood-pressure reading, without diagnosis of hypertension: Secondary | ICD-10-CM | POA: Insufficient documentation

## 2023-04-17 DIAGNOSIS — O99891 Other specified diseases and conditions complicating pregnancy: Secondary | ICD-10-CM | POA: Insufficient documentation

## 2023-04-17 LAB — COMPREHENSIVE METABOLIC PANEL
ALT: 14 U/L (ref 0–44)
AST: 17 U/L (ref 15–41)
Albumin: 2.7 g/dL — ABNORMAL LOW (ref 3.5–5.0)
Alkaline Phosphatase: 157 U/L — ABNORMAL HIGH (ref 38–126)
Anion gap: 7 (ref 5–15)
BUN: 6 mg/dL (ref 6–20)
CO2: 20 mmol/L — ABNORMAL LOW (ref 22–32)
Calcium: 8.5 mg/dL — ABNORMAL LOW (ref 8.9–10.3)
Chloride: 107 mmol/L (ref 98–111)
Creatinine, Ser: 0.54 mg/dL (ref 0.44–1.00)
GFR, Estimated: >60 mL/min (ref >60)
Glucose, Bld: 97 mg/dL (ref 70–99)
Potassium: 3.7 mmol/L (ref 3.5–5.1)
Sodium: 134 mmol/L — ABNORMAL LOW (ref 135–145)
Total Bilirubin: 0.5 mg/dL (ref <1.2)
Total Protein: 6.1 g/dL — ABNORMAL LOW (ref 6.5–8.1)

## 2023-04-17 LAB — CBC
HCT: 35.9 % — ABNORMAL LOW (ref 36.0–46.0)
Hemoglobin: 11.2 g/dL — ABNORMAL LOW (ref 12.0–15.0)
MCH: 25.1 pg — ABNORMAL LOW (ref 26.0–34.0)
MCHC: 31.2 g/dL (ref 30.0–36.0)
MCV: 80.3 fL (ref 80.0–100.0)
Platelets: 192 10*3/uL (ref 150–400)
RBC: 4.47 MIL/uL (ref 3.87–5.11)
RDW: 13.5 % (ref 11.5–15.5)
WBC: 12.4 10*3/uL — ABNORMAL HIGH (ref 4.0–10.5)
nRBC: 0 % (ref 0.0–0.2)

## 2023-04-17 LAB — PROTEIN / CREATININE RATIO, URINE
Creatinine, Urine: 54 mg/dL
Protein Creatinine Ratio: 0.15 mg/mg{creat} (ref 0.00–0.15)
Total Protein, Urine: 8 mg/dL

## 2023-04-17 MED ORDER — LABETALOL HCL 5 MG/ML IV SOLN: 40.0000 mg | INTRAVENOUS | Status: DC | PRN

## 2023-04-17 MED ORDER — NIFEDIPINE 10 MG PO CAPS: 20.0000 mg | ORAL_CAPSULE | ORAL | Status: DC | PRN

## 2023-04-17 MED ORDER — NIFEDIPINE 10 MG PO CAPS: 10.0000 mg | ORAL_CAPSULE | ORAL | Status: DC | PRN

## 2023-04-17 NOTE — MAU Note (Addendum)
Jodi Mckenzie is a 22 y.o. at [redacted]w[redacted]d here in MAU reporting: resting/laying in the bed 1 hour ago and felt like her heart was racing. States her HR has been "higher than normal" throughout her pregnancy but has never had high BP before. States she checked her BP this am and it was 121/90. States she feels like her toes are more swollen than normal. Denies any CTXs, VB, LOF and reports +FM.  LMP: n/a Onset of complaint: 0600 Pain score: 0 Vitals:   04/17/23 0713  BP: 132/89  Pulse: (!) 102  Resp: 17  Temp: 97.6 F (36.4 C)     FHT:150 external Lab orders placed from triage:

## 2023-04-17 NOTE — MAU Provider Note (Signed)
Chief Complaint  Patient presents with   Hypertension     Event Date/Time   First Provider Initiated Contact with Patient 04/17/23 [phone]     S: Jodi Mckenzie  is a 22 y.o. y.o. year old G47P1001 female at [redacted]w[redacted]d weeks gestation who presents to MAU with mildly elevated blood pressure at home of 121/90 and feeling like her heart rate was elevated.  No Hx HTN. Current blood pressure medication: None.  Has reported feeling of high heart rate to her OB/GYN and is scheduled to see cardiologist in early January.  Associated symptoms: Negative for headache, vision changes, epigastric pain Contractions: Rare, mild Vaginal bleeding: Denies Fetal movement: Normal  O: Patient Vitals for the past 24 hrs:  BP Temp Temp src Pulse Resp SpO2 Height Weight  04/17/23 0849 131/85 -- -- 95 20 -- -- --  04/17/23 0845 -- -- -- -- -- 99 % -- --  04/17/23 0801 123/74 -- -- 96 -- -- -- --  04/17/23 0800 -- -- -- -- -- 98 % -- --  04/17/23 0745 121/76 -- -- 92 -- 97 % -- --  04/17/23 0731 124/72 -- -- 88 -- -- -- --  04/17/23 0730 -- -- -- -- -- 99 % -- --  04/17/23 0727 130/88 -- -- (!) 107 -- -- -- --  04/17/23 0713 132/89 97.6 F (36.4 C) Oral (!) 102 17 -- 5\' 5"  (1.651 m) 70.8 kg   General: NAD Heart: Regular rate Lungs: Normal rate and effort Abd: Soft, NT, Gravid, S=D Extremities: Face pedal edema Neuro: 2+ deep tendon reflexes, No clonus Pelvic: Deferred    EFM: 145, Moderate variability, 15 x 15 accelerations, no decelerations Toco: Rare, mild  Results for orders placed or performed during the hospital encounter of 04/17/23 (from the past 24 hour(s))  Comprehensive metabolic panel     Status: Abnormal   Collection Time: 04/17/23  7:10 AM  Result Value Ref Range   Sodium 134 (L) 135 - 145 mmol/L   Potassium 3.7 3.5 - 5.1 mmol/L   Chloride 107 98 - 111 mmol/L   CO2 20 (L) 22 - 32 mmol/L   Glucose, Bld 97 70 - 99 mg/dL   BUN 6 6 - 20 mg/dL   Creatinine, Ser 1.19 0.44 - 1.00 mg/dL    Calcium 8.5 (L) 8.9 - 10.3 mg/dL   Total Protein 6.1 (L) 6.5 - 8.1 g/dL   Albumin 2.7 (L) 3.5 - 5.0 g/dL   AST 17 15 - 41 U/L   ALT 14 0 - 44 U/L   Alkaline Phosphatase 157 (H) 38 - 126 U/L   Total Bilirubin 0.5 <1.2 mg/dL   GFR, Estimated >14 >78 mL/min   Anion gap 7 5 - 15  CBC     Status: Abnormal   Collection Time: 04/17/23  7:10 AM  Result Value Ref Range   WBC 12.4 (H) 4.0 - 10.5 K/uL   RBC 4.47 3.87 - 5.11 MIL/uL   Hemoglobin 11.2 (L) 12.0 - 15.0 g/dL   HCT 29.5 (L) 62.1 - 30.8 %   MCV 80.3 80.0 - 100.0 fL   MCH 25.1 (L) 26.0 - 34.0 pg   MCHC 31.2 30.0 - 36.0 g/dL   RDW 65.7 84.6 - 96.2 %   Platelets 192 150 - 400 K/uL   nRBC 0.0 0.0 - 0.2 %  Protein / creatinine ratio, urine     Status: None   Collection Time: 04/17/23  7:25 AM  Result Value Ref Range  Creatinine, Urine 54 mg/dL   Total Protein, Urine 8 mg/dL   Protein Creatinine Ratio 0.15 0.00 - 0.15 mg/mg[Cre]   EKG shows normal sinus rhythm.  MAU Course Orders Placed This Encounter  Procedures   Comprehensive metabolic panel    Standing Status:   Standing    Number of Occurrences:   1   CBC    Standing Status:   Standing    Number of Occurrences:   1   Protein / creatinine ratio, urine    Standing Status:   Standing    Number of Occurrences:   1   Notify physician (specify) Confirmatory reading of BP> 160/110 15 minutes later    Confirmatory reading of BP> 160/110 15 minutes later    Standing Status:   Standing    Number of Occurrences:   1    Order Specific Question:   Notify Physician    Answer:   Temp greater than or equal to 100.4    Order Specific Question:   Notify Physician    Answer:   RR greater than 24 or less than 10    Order Specific Question:   Notify Physician    Answer:   HR greater than 120 or less than 50    Order Specific Question:   Notify Physician    Answer:   SBP greater than 160 mmHG or less than 80 mmHG    Order Specific Question:   Notify Physician    Answer:   DBP greater  than 110 mmHG or less than 45 mmHG    Order Specific Question:   Notify Physician    Answer:   Urinary output is less than for any 4 hour period   Apply Hypertensive Disorders of Pregnancy Care Plan    Standing Status:   Standing    Number of Occurrences:   1   Measure blood pressure    10 minutes after giving labetalol 40 MG IV dose.  Call MD if SBP >/= 160 or DBP >/= 110.    Standing Status:   Standing    Number of Occurrences:   1   ED EKG    Standing Status:   Standing    Number of Occurrences:   1    Order Specific Question:   Reason for Exam    Answer:   Chest Pain   Discharge patient    Order Specific Question:   Discharge disposition    Answer:   01-Home or Self Care [1]    Order Specific Question:   Discharge patient date    Answer:   04/17/2023   Meds ordered this encounter  Medications   AND Linked Order Group    NIFEdipine (PROCARDIA) capsule 10 mg    NIFEdipine (PROCARDIA) capsule 20 mg    NIFEdipine (PROCARDIA) capsule 20 mg    labetalol (NORMODYNE) injection 40 mg     MDM -Single mildly elevated blood pressure at home with normal blood pressures in MAU although they do appear to be trending up from what they were previously in pregnancy.  No evidence of preeclampsia based on symptoms or labs.  Precautions reviewed. -Mild sinus tachycardia.  No history of severe tachycardia, syncope or near syncope.  EKG.  Follow-up with cardiology as recommended.  Tachycardia precautions reviewed.  A: [redacted]w[redacted]d week IUP 1. [redacted] weeks gestation of pregnancy   2. Tachycardia   3. Hypertension affecting pregnancy in third trimester    P: Discharge home in stable condition. Preeclampsia  precautions. Follow-up for blood pressure check in 3 days at your doctor's office sooner as needed if symptoms worsen. Return to maternity admissions as needed in emergencies  [place], [place], [place] 04/17/2023 8:53 AM

## 2023-04-17 NOTE — MAU Provider Note (Deleted)
Chief Complaint:  Hypertension  HPI  HPI: Jodi Mckenzie is a 22 y.o. G2P1001 at 66w2dwho presents to maternity admissions reporting heart racing while laying in bed. She checked her BP this morning due to the fast heart rate and it was 121/90. States her heart rate has been higher than normal throughout her pregnancy. She feels like her toes are more swollen than normal this morning. She has been referred to [facility], but her appointment is not until January.  She reports good fetal movement, denies LOF, vaginal bleeding, vaginal itching/burning, urinary symptoms, h/a, dizziness, n/v, diarrhea, constipation or fever/chills.  She denies headache, visual changes or RUQ abdominal pain.   Past Medical History: Past Medical History:  Diagnosis Date   GERD (gastroesophageal reflux disease)     Past obstetric history: OB History  Gravida Para Term Preterm AB Living  2 1 1     1   SAB IAB Ectopic Multiple Live Births        0 1    # Outcome Date GA Lbr Len/2nd Weight Sex Type Anes PTL Lv  2 Current           1 Term 06/05/21 [redacted]w[redacted]d  3841 g M Vag-Vacuum None, Local  LIV    Past Surgical History: Past Surgical History:  Procedure Laterality Date   NO PAST SURGERIES      Family History: Family History  Problem Relation Age of Onset   Thyroid disease Mother    Diabetes Father     Social History: Social History   Tobacco Use   Smoking status: Never   Smokeless tobacco: Never  Vaping Use   Vaping status: Never Used  Substance Use Topics   Alcohol use: Never   Drug use: Never    Allergies:  Allergies  Allergen Reactions   Latex Itching and Hives    Meds:  Medications Prior to Admission  Medication Sig Dispense Refill Last Dose   Blood Pressure Monitoring (BLOOD PRESSURE KIT) DEVI 1 kit by Does not apply route once a week. 1 each 0 04/17/2023   prenatal vitamin w/FE, FA (PRENATAL 1 + 1) 27-1 MG TABS tablet Take 1 tablet by mouth daily at 12 noon.   04/16/2023    cyclobenzaprine (FLEXERIL) 5 MG tablet Take 1 tablet (5 mg total) by mouth every 8 (eight) hours as needed for muscle spasms. (Patient not taking: Reported on 03/17/2023) 30 tablet 0 Unknown   famotidine (PEPCID) 20 MG tablet Take 1 tablet (20 mg total) by mouth 2 (two) times daily. (Patient not taking: Reported on 03/17/2023) 60 tablet 3 Unknown   Misc. Devices (GOJJI WEIGHT SCALE) MISC 1 Device by Does not apply route every 30 (thirty) days. (Patient not taking: Reported on 03/17/2023) 1 each 0 Unknown    I have reviewed patient's Past Medical Hx, Surgical Hx, Family Hx, Social Hx, medications and allergies.   ROS:  Review of Systems Other systems negative  Physical Exam  Patient Vitals for the past 24 hrs:  BP Temp Temp src Pulse Resp SpO2 Height Weight  04/17/23 0731 124/72 -- -- 88 -- -- -- --  04/17/23 0730 -- -- -- -- -- 99 % -- --  04/17/23 0727 130/88 -- -- (!) 107 -- -- -- --  04/17/23 0713 132/89 97.6 F (36.4 C) Oral (!) 102 17 -- 5\' 5"  (1.651 m) 70.8 kg   Constitutional: Well-developed, well-nourished female in no acute distress.  Cardiovascular: normal rate and rhythm Respiratory: normal effort, clear to auscultation bilaterally  GI: Abd soft, non-tender, gravid appropriate for gestational age.   No rebound or guarding. MS: Extremities nontender, no edema, normal ROM Neurologic: Alert and oriented x 4.  GU: Neg CVAT.  FHT:  Baseline 140, moderate variability, accelerations present, no decelerations Contractions: Irritability   Labs: Results for orders placed or performed during the hospital encounter of 04/17/23 (from the past 24 hour(s))  Comprehensive metabolic panel     Status: Abnormal   Collection Time: 04/17/23  7:10 AM  Result Value Ref Range   Sodium 134 (L) 135 - 145 mmol/L   Potassium 3.7 3.5 - 5.1 mmol/L   Chloride 107 98 - 111 mmol/L   CO2 20 (L) 22 - 32 mmol/L   Glucose, Bld 97 70 - 99 mg/dL   BUN 6 6 - 20 mg/dL   Creatinine, Ser 1.61 0.44 - 1.00  mg/dL   Calcium 8.5 (L) 8.9 - 10.3 mg/dL   Total Protein 6.1 (L) 6.5 - 8.1 g/dL   Albumin 2.7 (L) 3.5 - 5.0 g/dL   AST 17 15 - 41 U/L   ALT 14 0 - 44 U/L   Alkaline Phosphatase 157 (H) 38 - 126 U/L   Total Bilirubin 0.5 <1.2 mg/dL   GFR, Estimated >09 >60 mL/min   Anion gap 7 5 - 15  CBC     Status: Abnormal   Collection Time: 04/17/23  7:10 AM  Result Value Ref Range   WBC 12.4 (H) 4.0 - 10.5 K/uL   RBC 4.47 3.87 - 5.11 MIL/uL   Hemoglobin 11.2 (L) 12.0 - 15.0 g/dL   HCT 45.4 (L) 09.8 - 11.9 %   MCV 80.3 80.0 - 100.0 fL   MCH 25.1 (L) 26.0 - 34.0 pg   MCHC 31.2 30.0 - 36.0 g/dL   RDW 14.7 82.9 - 56.2 %   Platelets 192 150 - 400 K/uL   nRBC 0.0 0.0 - 0.2 %   O/Positive/-- (05/15 1422)  Imaging:  No results found.  MAU Course/MDM: I have reviewed the triage vital signs and the nursing notes.   Pertinent labs & imaging results that were available during my care of the patient were reviewed by me and considered in my medical decision making (see chart for details).      I have reviewed her medical records including past results, notes and treatments.   I have ordered labs and reviewed results.  NST reviewed  Treatments in MAU included CBC, CMP, Urine pro/cr, EKG, NST.    Assessment: 1. [redacted] weeks gestation of pregnancy   2. Tachycardia   3. Hypertension affecting pregnancy in third trimester   EKG: nonischemic NSR, rate 93 Mild range SBP 130s Preeclampsia labs pending  Plan: Care relinquished to [facility]  Wyn Forster, MD FMOB Fellow, Faculty practice [facility], Center for [facility]  04/17/2023 8:00 AM

## 2023-04-28 ENCOUNTER — Ambulatory Visit (INDEPENDENT_AMBULATORY_CARE_PROVIDER_SITE_OTHER): Payer: Medicaid Other

## 2023-04-28 ENCOUNTER — Other Ambulatory Visit (HOSPITAL_COMMUNITY)
Admission: RE | Admit: 2023-04-28 | Discharge: 2023-04-28 | Disposition: A | Discharge status: Home / Self Care | Payer: Medicaid Other | Source: Ambulatory Visit

## 2023-04-28 VITALS — BP 133/85 | HR 85 | Wt 158.4 lb

## 2023-04-28 DIAGNOSIS — Z3A37 37 weeks gestation of pregnancy: Secondary | ICD-10-CM | POA: Diagnosis present

## 2023-04-28 DIAGNOSIS — Z3403 Encounter for supervision of normal first pregnancy, third trimester: Secondary | ICD-10-CM

## 2023-04-28 DIAGNOSIS — Z3480 Encounter for supervision of other normal pregnancy, unspecified trimester: Secondary | ICD-10-CM | POA: Diagnosis present

## 2023-04-28 NOTE — Progress Notes (Signed)
   LOW-RISK PREGNANCY OFFICE VISIT  Patient name: Emojean Wineberg MRN [id number]  Date of birth: [birth year] Chief Complaint:   Routine Prenatal Visit  Subjective:   Jodi Mckenzie is a 22 y.o. G77P1001 female at [redacted]w[redacted]d with an Estimated Date of Delivery: 05/13/23 being seen today for ongoing management of a low-risk pregnancy aeb has Supervision of other normal pregnancy, antepartum; Cystic fibrosis carrier, antepartum; and Postural tremor on their problem list.  Patient presents today, with the FOB , with occasional contractions and cramping.  Patient endorses fetal movement.  Patient denies vaginal concerns including abnormal discharge, leaking of fluid, and bleeding. No issues with urination, constipation, or diarrhea.    Contractions: Irritability. Vag. Bleeding: None.  Movement: Present.  Reviewed past medical,surgical, social, obstetrical and family history as well as problem list, medications and allergies.  Objective   Vitals:   04/28/23 1354  BP: 133/85  Pulse: 85  Weight: 158 lb 6.4 oz (71.8 kg)  Body mass index is 26.36 kg/m.  Total Weight Gain:39 lb 6.4 oz (17.9 kg)         Physical Examination:   General appearance: Well appearing, and in no distress  Mental status: Alert, oriented to person, place, and time  Skin: Warm & dry  Cardiovascular: Normal heart rate noted  Respiratory: Normal respiratory effort, no distress  Abdomen: Soft, gravid, nontender, AGA with Fundal Height: 38 cm  Pelvic: Cervical exam performed  Dilation: 2.5 Effacement (%): 50 Station: -3 Presentation: Vertex  Extremities: Edema: Trace  Fetal Status: Fetal Heart Rate (bpm): 162  Movement: Present   No results found for this or any previous visit (from the past 24 hour(s)).  Assessment & Plan:  Low-risk pregnancy of a 22 y.o., G2P1001 at [redacted]w[redacted]d with an Estimated Date of Delivery: 05/13/23   1. Supervision of other normal pregnancy, antepartum -Anticipatory guidance for upcoming  appts. -Patient to schedule next appt in 1 weeks for an in-person visit. -Labs as below. -Educated on GBS bacteria including what it is, why we test, and how and when we treat if needed. -Discussed releasing of results to mychart.  2. [redacted] weeks gestation of pregnancy -Doing well. -Reassured that some cramping/contractions is to be expected. -Labor precautions reviewed.       Meds: No orders of the defined types were placed in this encounter.  Labs/procedures today:  Lab Orders  No laboratory test(s) ordered today     Reviewed: Term labor symptoms and general obstetric precautions including but not limited to vaginal bleeding, contractions, leaking of fluid and fetal movement were reviewed in detail with the patient.  All questions were answered.  Follow-up: Return in about 1 week (around 05/05/2023) for LROB.  No orders of the defined types were placed in this encounter.  Cherre Robins MSN, CNM 04/28/2023

## 2023-04-29 ENCOUNTER — Inpatient Hospital Stay (HOSPITAL_COMMUNITY)
Admission: AD | Admit: 2023-04-29 | Discharge: 2023-05-01 | DRG: 807 | Disposition: A | Discharge status: Home / Self Care | Payer: Medicaid Other | Attending: Family Medicine | Admitting: Family Medicine

## 2023-04-29 ENCOUNTER — Encounter (HOSPITAL_COMMUNITY): Payer: Self-pay | Admitting: Obstetrics and Gynecology

## 2023-04-29 DIAGNOSIS — Z833 Family history of diabetes mellitus: Secondary | ICD-10-CM

## 2023-04-29 DIAGNOSIS — Z3A38 38 weeks gestation of pregnancy: Secondary | ICD-10-CM

## 2023-04-29 DIAGNOSIS — O134 Gestational [pregnancy-induced] hypertension without significant proteinuria, complicating childbirth: Secondary | ICD-10-CM | POA: Diagnosis present

## 2023-04-29 DIAGNOSIS — Z3480 Encounter for supervision of other normal pregnancy, unspecified trimester: Principal | ICD-10-CM

## 2023-04-29 DIAGNOSIS — O26893 Other specified pregnancy related conditions, third trimester: Secondary | ICD-10-CM | POA: Diagnosis present

## 2023-04-29 DIAGNOSIS — Z141 Cystic fibrosis carrier: Secondary | ICD-10-CM | POA: Diagnosis not present

## 2023-04-29 DIAGNOSIS — Z91040 Latex allergy status: Secondary | ICD-10-CM | POA: Diagnosis not present

## 2023-04-29 LAB — CERVICOVAGINAL ANCILLARY ONLY
Chlamydia: NEGATIVE
Comment: NEGATIVE
Comment: NORMAL
Neisseria Gonorrhea: NEGATIVE

## 2023-04-29 LAB — COMPREHENSIVE METABOLIC PANEL
ALT: 15 U/L (ref 0–44)
AST: 21 U/L (ref 15–41)
Albumin: 3.4 g/dL — ABNORMAL LOW (ref 3.5–5.0)
Alkaline Phosphatase: 186 U/L — ABNORMAL HIGH (ref 38–126)
Anion gap: 13 (ref 5–15)
BUN: <5 mg/dL — ABNORMAL LOW (ref 6–20)
CO2: 18 mmol/L — ABNORMAL LOW (ref 22–32)
Calcium: 10 mg/dL (ref 8.9–10.3)
Chloride: 105 mmol/L (ref 98–111)
Creatinine, Ser: 0.48 mg/dL (ref 0.44–1.00)
GFR, Estimated: >60 mL/min (ref >60)
Glucose, Bld: 77 mg/dL (ref 70–99)
Potassium: 3.6 mmol/L (ref 3.5–5.1)
Sodium: 136 mmol/L (ref 135–145)
Total Bilirubin: 0.7 mg/dL (ref <1.2)
Total Protein: 7.7 g/dL (ref 6.5–8.1)

## 2023-04-29 LAB — TYPE AND SCREEN
ABO/RH(D): O POS
Antibody Screen: NEGATIVE

## 2023-04-29 LAB — CBC
HCT: 43.7 % (ref 36.0–46.0)
Hemoglobin: 14 g/dL (ref 12.0–15.0)
MCH: 25.6 pg — ABNORMAL LOW (ref 26.0–34.0)
MCHC: 32 g/dL (ref 30.0–36.0)
MCV: 80 fL (ref 80.0–100.0)
Platelets: 276 10*3/uL (ref 150–400)
RBC: 5.46 MIL/uL — ABNORMAL HIGH (ref 3.87–5.11)
RDW: 15.2 % (ref 11.5–15.5)
WBC: 17.9 10*3/uL — ABNORMAL HIGH (ref 4.0–10.5)
nRBC: 0 % (ref 0.0–0.2)

## 2023-04-29 LAB — RPR: RPR Ser Ql: NONREACTIVE

## 2023-04-29 MED ORDER — ONDANSETRON HCL 4 MG/2ML IJ SOLN: 4.0000 mg | INTRAMUSCULAR | Status: DC | PRN

## 2023-04-29 MED ORDER — ACETAMINOPHEN 325 MG PO TABS: 650.0000 mg | ORAL_TABLET | ORAL | Status: DC | PRN

## 2023-04-29 MED ORDER — SOD CITRATE-CITRIC ACID 500-334 MG/5ML PO SOLN: 30.0000 mL | ORAL | Status: DC | PRN

## 2023-04-29 MED ORDER — SIMETHICONE 80 MG PO CHEW: 80.0000 mg | CHEWABLE_TABLET | ORAL | Status: DC | PRN

## 2023-04-29 MED ORDER — OXYCODONE HCL 5 MG PO TABS
5.0000 mg | ORAL_TABLET | ORAL | Status: DC | PRN
Start: 2023-04-29 — End: 2023-05-01

## 2023-04-29 MED ORDER — PRENATAL MULTIVITAMIN CH
1.0000 | ORAL_TABLET | Freq: Every day | ORAL | Status: DC
  Administered 2023-04-30 – 2023-05-01 (×2): 1 via ORAL
  Filled 2023-04-29 (×2): qty 1

## 2023-04-29 MED ORDER — LACTATED RINGERS IV SOLN
500.0000 mL | INTRAVENOUS | Status: DC | PRN
Start: 2023-04-29 — End: 2023-04-29

## 2023-04-29 MED ORDER — COCONUT OIL OIL: 1.0000 | TOPICAL_OIL | Status: DC | PRN

## 2023-04-29 MED ORDER — OXYCODONE-ACETAMINOPHEN 5-325 MG PO TABS: 1.0000 | ORAL_TABLET | ORAL | Status: DC | PRN

## 2023-04-29 MED ORDER — OXYCODONE-ACETAMINOPHEN 5-325 MG PO TABS: 2.0000 | ORAL_TABLET | ORAL | Status: DC | PRN

## 2023-04-29 MED ORDER — LACTATED RINGERS IV SOLN: INTRAVENOUS | Status: DC

## 2023-04-29 MED ORDER — OXYCODONE HCL 5 MG PO TABS: 10.0000 mg | ORAL_TABLET | ORAL | Status: DC | PRN

## 2023-04-29 MED ORDER — OXYTOCIN-SODIUM CHLORIDE 30-0.9 UT/500ML-% IV SOLN
2.5000 [IU]/h | INTRAVENOUS | Status: DC
Start: 2023-04-29 — End: 2023-04-29

## 2023-04-29 MED ORDER — ONDANSETRON HCL 4 MG/2ML IJ SOLN: 4.0000 mg | Freq: Four times a day (QID) | INTRAMUSCULAR | Status: DC | PRN

## 2023-04-29 MED ORDER — IBUPROFEN 600 MG PO TABS
600.0000 mg | ORAL_TABLET | Freq: Four times a day (QID) | ORAL | Status: DC
  Administered 2023-04-29 – 2023-05-01 (×8): 600 mg via ORAL
  Filled 2023-04-29 (×7): qty 1

## 2023-04-29 MED ORDER — FLEET ENEMA RE ENEM: 1.0000 | ENEMA | Freq: Every day | RECTAL | Status: DC | PRN

## 2023-04-29 MED ORDER — DOCUSATE SODIUM 100 MG PO CAPS
100.0000 mg | ORAL_CAPSULE | Freq: Two times a day (BID) | ORAL | Status: DC
  Administered 2023-04-30 – 2023-05-01 (×4): 100 mg via ORAL
  Filled 2023-04-29 (×4): qty 1

## 2023-04-29 MED ORDER — LIDOCAINE HCL (PF) 1 % IJ SOLN: 30.0000 mL | INTRAMUSCULAR | Status: AC | PRN

## 2023-04-29 MED ORDER — WITCH HAZEL-GLYCERIN EX PADS: 1.0000 | MEDICATED_PAD | CUTANEOUS | Status: DC | PRN

## 2023-04-29 MED ORDER — OXYTOCIN BOLUS FROM INFUSION: 333.0000 mL | Freq: Once | INTRAVENOUS | Status: DC

## 2023-04-29 MED ORDER — DIBUCAINE (PERIANAL) 1 % EX OINT: 1.0000 | TOPICAL_OINTMENT | CUTANEOUS | Status: DC | PRN

## 2023-04-29 MED ORDER — ONDANSETRON HCL 4 MG PO TABS: 4.0000 mg | ORAL_TABLET | ORAL | Status: DC | PRN

## 2023-04-29 MED ORDER — DIPHENHYDRAMINE HCL 25 MG PO CAPS: 25.0000 mg | ORAL_CAPSULE | Freq: Four times a day (QID) | ORAL | Status: DC | PRN

## 2023-04-29 MED ORDER — LIDOCAINE HCL (PF) 1 % IJ SOLN
INTRAMUSCULAR | Status: AC
  Administered 2023-04-29: 30 mL via SUBCUTANEOUS
  Filled 2023-04-29: qty 30

## 2023-04-29 MED ORDER — BENZOCAINE-MENTHOL 20-0.5 % EX AERO: 1.0000 | INHALATION_SPRAY | CUTANEOUS | Status: DC | PRN

## 2023-04-29 NOTE — Discharge Summary (Cosign Needed)
Postpartum Discharge Summary      Patient Name: Jodi Mckenzie DOB: 04-09-2001 MRN: [id number]  Date of admission: 04/29/2023 Delivery date:04/29/2023 Delivering provider: Federico Flake Date of discharge: 05/01/2023  Admitting diagnosis: Normal labor [O80, Z37.9] Intrauterine pregnancy: [redacted]w[redacted]d     Secondary diagnosis:  Principal Problem:   Normal labor   Waterbirth ?GHTN  Additional problems: none    Discharge diagnosis: Gestational Hypertension                                              Post partum procedures: none Augmentation: AROM Complications: None  Hospital course: Onset of Labor With Vaginal Delivery      22 y.o. yo [id number] at [redacted]w[redacted]d was admitted in latent labor on 04/29/2023. She was found to have elevated BP so labor augmented with AROM. Labor course was uncomplicated.  Membrane Rupture Time/Date: 1:00 PM,04/29/2023  Delivery Method:Vaginal, Spontaneous Operative Delivery:N/A Episiotomy: None Lacerations:  2nd degree Patient had a postpartum course complicated by nothing.  She is ambulating, tolerating a regular diet, passing flatus, and urinating well. Patient is discharged home in stable condition on 05/01/23.  Newborn Data: Birth date:04/29/2023 Birth time:1:01 PM Gender:Female Living status:Living Apgars:4 ,8  Weight:3790 g  Magnesium Sulfate received: No BMZ received: No Rhophylac:N/A MMR:N/A T-DaP:Given prenatally Flu: Yes RSV Vaccine received: No Transfusion:No  Immunizations received: Immunization History  Administered Date(s) Administered   Influenza, Seasonal, Injecte, Preservative Fre 02/16/2023   Tdap 02/16/2023    Physical exam  Vitals:   04/30/23 0625 04/30/23 1500 04/30/23 2100 05/01/23 0538  BP: 101/70 111/79 112/68 109/62  Pulse: 79 81 70 93  Resp: 18  18 18   Temp: 97.8 F (36.6 C) 97.9 F (36.6 C) 98 F (36.7 C) 97.8 F (36.6 C)  TempSrc: Oral Oral Oral Oral  SpO2: 100% 98% 99% 98%  Weight:      Height:        General: alert, cooperative, and no distress Lochia: appropriate Uterine Fundus: firm Incision: N/A DVT Evaluation: No evidence of DVT seen on physical exam. Labs: Lab Results  Component Value Date   WBC 13.8 (H) 05/01/2023   HGB 11.6 (L) 05/01/2023   HCT 36.9 05/01/2023   MCV 80.9 05/01/2023   PLT 248 05/01/2023      Latest Ref Rng & Units 04/29/2023   11:24 AM  CMP  Glucose 70 - 99 mg/dL 77   BUN 6 - 20 mg/dL <5   Creatinine 4.40 - 1.00 mg/dL 3.47   Sodium 425 - 956 mmol/L 136   Potassium 3.5 - 5.1 mmol/L 3.6   Chloride 98 - 111 mmol/L 105   CO2 22 - 32 mmol/L 18   Calcium 8.9 - 10.3 mg/dL 38.7   Total Protein 6.5 - 8.1 g/dL 7.7   Total Bilirubin <5.6 mg/dL 0.7   Alkaline Phos 38 - 126 U/L 186   AST 15 - 41 U/L 21   ALT 0 - 44 U/L 15    Edinburgh Score:    04/30/2023    6:25 AM  Edinburgh Postnatal Depression Scale Screening Tool  I have been able to laugh and see the funny side of things. 0  I have looked forward with enjoyment to things. 0  I have blamed myself unnecessarily when things went wrong. 0  I have been anxious or worried for no good reason. 0  I have felt scared or panicky for no good reason. 0  Things have been getting on top of me. 0  I have been so unhappy that I have had difficulty sleeping. 0  I have felt sad or miserable. 0  I have been so unhappy that I have been crying. 0  The thought of harming myself has occurred to me. 0  Edinburgh Postnatal Depression Scale Total 0   Edinburgh Postnatal Depression Scale Total: 0   After visit meds:  Allergies as of 05/01/2023       Reactions   Latex Itching, Hives        Medication List     TAKE these medications    Blood Pressure Kit Devi 1 kit by Does not apply route once a week.   cyclobenzaprine 5 MG tablet Commonly known as: FLEXERIL Take 1 tablet (5 mg total) by mouth every 8 (eight) hours as needed for muscle spasms.   famotidine 20 MG tablet Commonly known as:  PEPCID Take 1 tablet (20 mg total) by mouth 2 (two) times daily.   Gojji Weight Scale Misc 1 Device by Does not apply route every 30 (thirty) days.   ibuprofen 600 MG tablet Commonly known as: ADVIL Take 1 tablet (600 mg total) by mouth every 6 (six) hours as needed.   Iron (Ferrous Sulfate) 325 (65 Fe) MG Tabs Take by mouth.   prenatal vitamin w/FE, FA 27-1 MG Tabs tablet Take 1 tablet by mouth daily at 12 noon.         Discharge home in stable condition Infant Feeding: Breast Infant Disposition:home with mother Discharge instruction: per After Visit Summary and Postpartum booklet. Activity: Advance as tolerated. Pelvic rest for 6 weeks.  Diet: routine diet Future Appointments: Future Appointments  Date Time Provider Department Center  05/07/2023 11:00 AM [facility] NURSE [facility] None  05/27/2023  3:50 PM Joanne Gavel, MD [facility] None  05/28/2023  2:00 PM Tobb, Lavona Mound, DO [facility] None   Follow up Visit:  Follow-up Information     [facility] for [facility] at [facility]. Go on 05/07/2023.   Specialty: Obstetrics and Gynecology Contact information: [address], [address] [place] [address] [phone]        [facility] for [employer] at [place]. Go on 05/27/2023.   Specialty: Obstetrics and Gynecology Why: routine after baby visit Contact information: [address], [address] [place] [address] [phone]               Message sent to [facility] 12/12  Please schedule this patient for a In person postpartum visit in 4 weeks with the following provider: Any provider. Additional Postpartum F/U:BP check 1 week  High risk pregnancy complicated by: HTN Delivery mode:  Vaginal, Spontaneous Anticipated Birth Control:  IUD postpartum    05/01/2023 Gardner Bing, MD

## 2023-04-29 NOTE — MAU Note (Signed)
.  Jodi Mckenzie is a 22 y.o. at [redacted]w[redacted]d here in MAU reporting: ctx since 6:30 about 3 min apart. Denmie any vag bleeding or leaking at this time. Good fetal movement reported. 2cm yesterday at office visit.  LMP:  Onset of complaint: 6:30 Pain score: 8 Vitals:   04/29/23 1056  BP: (!) 145/94  Pulse: (!) 123  Resp: 18  Temp: 98 F (36.7 C)     FHT:156 Lab orders placed from triage:   Labor eval

## 2023-04-29 NOTE — H&P (Signed)
Jodi Mckenzie is a 22 y.o. female, G2P1001 at 33 weeks, presenting for active labor.  Patient receives care at [facility] and was supervised for a low-risk pregnancy. Pregnancy and medical history significant for problems as listed below. She is GBS unknown and expresses a desire for no method for pain management.  She is anticipating a female infant and requests IUD for PP birth control method.     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  Total Weight Gain:39 lb 6.4 oz (17.9 kg)    NURSING  PROVIDER  Office Location [facility] Dating by U/S at 7 wks  [facility] Model Traditional Anatomy U/S Normal  Initiated care at  [facility]  English              LAB RESULTS   Support Person Jodi Mckenzie Genetics NIPS:  LR female     AFP: screen neg       NT/IT (FT only)     Carrier Screen Horizon: CF carrier  Rhogam  O/Positive/-- (05/15 1422) A1C/GTT Early:             Third trimester: normal  Flu Vaccine 02/16/23    TDaP Vaccine  02/16/23 Blood Type O/Positive/-- (05/15 1422)  Covid Vaccine Vaccinated Antibody Negative (05/15 1422)    Rubella 6.10 (05/15 1422)  Feeding Plan Breast RPR Non Reactive (10/01 1610)  Contraception IUD - OP HBsAg Negative (05/15 1422)  Circumcision Declined HIV Non Reactive (10/01 0851)  Pediatrician  [facility] HCVAb Non Reactive (05/15 1422)  Prenatal Classes       Pap Pap postpartum at age 52  BTLConsent  GC/CT Initial:             36wks:  VBAC  Consent  GBS   For PCN allergy, check sensitivities        DME Rx Arly.Keller ] BP cuff [ X] Weight Scale Waterbirth  [ ]  Class [ ]  Consent [ ]  CNM visit  PHQ9 &  GAD7 [ X ] new OB Arly.Keller ] 28 weeks  [  ] 36 weeks Induction  [ ]  Orders Entered [ ] Foley Y/N     OB History     Gravida  2   Para  1   Term  1   Preterm      AB      Living  1      SAB      IAB      Ectopic      Multiple  0   Live Births  1             Past Medical History:  Diagnosis Date   GERD (gastroesophageal reflux disease)    Past Surgical History:  Procedure Laterality Date   NO PAST SURGERIES     Family History: family history includes Diabetes in her father; Thyroid disease in her mother. Social History:  reports that she has never smoked. She has never used smokeless tobacco. She reports that she does not drink alcohol and does not use  drugs.   Prenatal Transfer Tool  Maternal Diabetes: No Genetic Screening: Normal Maternal Ultrasounds/Referrals: Normal Fetal Ultrasounds or other Referrals:  None Maternal Substance Abuse:  No Significant Maternal Medications:  None Significant Maternal Lab Results: Other: GBS Negative   Maternal Assessment:  ROS: +Contractions, -LOF, +Vaginal Bleeding, +Fetal Movement  All other systems reviewed and negative.    Allergies  Allergen Reactions   Latex Itching and Hives     Dilation: 5.5 Effacement (%): 100 Station: -1 Exam by:: Gerrit Heck, CNM Blood pressure (!) 147/88, pulse (!) 110, temperature 98 F (36.7 C), resp. rate 18, height 5\' 5"  (1.651 m), weight 69.9 kg, last menstrual period 07/28/2022, currently breastfeeding.  Physical Exam Vitals reviewed.  Constitutional:      Appearance: Normal appearance.  HENT:     Head: Normocephalic and atraumatic.  Eyes:     Conjunctiva/sclera: Conjunctivae normal.  Cardiovascular:     Rate and Rhythm: Normal rate.     Heart sounds: Normal heart sounds.  Pulmonary:     Effort: Pulmonary effort is normal. No respiratory distress.     Breath sounds: Normal breath sounds.  Abdominal:     Palpations: Abdomen is soft.  Musculoskeletal:         General: Normal range of motion.     Cervical back: Normal range of motion.  Skin:    General: Skin is warm and dry.  Neurological:     Mental Status: She is alert and oriented to person, place, and time.  Psychiatric:        Mood and Affect: Mood normal.        Behavior: Behavior normal.     Fetal Assessment: Leopolds: -Pelvis: Proven to 3841 grams -EFW: 7lbs -Presentation: Vertex  FHR: 150 bpm, Mod Var, -Decels, +Accels UCs:  Q2-59min    Assessment IUP at 38.0 weeks Cat I FT Active Labor GBS Unknown Elevated BPs   Plan: Admit to [facility] [facility]  Routine Labor and Delivery Orders per Protocol In room to complete assessment and discuss POC: Informed that in setting of elevated bps she is not yet able to get in tub. Will monitor and reassess. Labs ordered and will continue to monitor GBS in previous pregnancy was negative, will treat as negative.    Joellyn Quails, MSN 04/29/2023, 11:27 AM

## 2023-04-29 NOTE — Progress Notes (Signed)
Jodi Mckenzie MRN: [id number]  Subjective: -Chart review.  Nurse reports patient in shower.   Objective: BP 132/76   Pulse (!) 109   Temp 98 F (36.7 C)   Resp 18   Ht 5\' 5"  (1.651 m)   Wt 69.9 kg   LMP 07/28/2022 (Approximate)   BMI 25.63 kg/m  No intake/output data recorded. No intake/output data recorded.  Fetal Monitoring: FHT: 145 bpm, Mod Var, -Decels, +Accels UC: Q2-2min    Vaginal Exam: SVE:   Dilation: 5.5 Effacement (%): 100 Station: -1 Exam by:: Gerrit Heck, CNM Membranes:Intact Internal Monitors: None  Augmentation/Induction: Pitocin: None Cytotec: None  Assessment:  IUP at 38  Cat I FT  Elevated BP  Plan: -Labs return negative for PreEclampsia.   -Nurse instructed to obtain 2-3 more blood pressures and if normal patient okay to proceed to water immersion as desired.  -Provider will come to bedside to sign consents. -Continue other mgmt as ordered.   Valma Cava, CNM Advanced Practice Provider, [facility] 04/29/2023, 12:36 PM

## 2023-04-29 NOTE — Progress Notes (Signed)
SVD baby boy, in tub, skin to skin with mother

## 2023-04-30 ENCOUNTER — Other Ambulatory Visit: Payer: Self-pay

## 2023-04-30 LAB — CBC
HCT: 36.1 % (ref 36.0–46.0)
Hemoglobin: 11.7 g/dL — ABNORMAL LOW (ref 12.0–15.0)
MCH: 25.8 pg — ABNORMAL LOW (ref 26.0–34.0)
MCHC: 32.4 g/dL (ref 30.0–36.0)
MCV: 79.7 fL — ABNORMAL LOW (ref 80.0–100.0)
Platelets: 238 10*3/uL (ref 150–400)
RBC: 4.53 MIL/uL (ref 3.87–5.11)
RDW: 15.2 % (ref 11.5–15.5)
WBC: 20.8 10*3/uL — ABNORMAL HIGH (ref 4.0–10.5)
nRBC: 0 % (ref 0.0–0.2)

## 2023-04-30 MED ORDER — IBUPROFEN 600 MG PO TABS: 600.0000 mg | ORAL_TABLET | Freq: Four times a day (QID) | ORAL | 0 refills | Status: DC

## 2023-04-30 NOTE — Lactation Note (Signed)
This note was copied from a baby's chart. Lactation Consultation Note  Patient Name: Jodi Mckenzie [id number] Date: 04/30/2023 Age:20 hours Reason for consult: Initial assessment;Early term 37-38.6wks Experienced BF mom stated she hasn't been to long stopped BF her 32 month old. Mom doesn't feel like she needs any assistance w/BF this new baby. Encouraged mom if she changes her mind to call. Left brochure for OP LC services.   Maternal Data Does the patient have breastfeeding experience prior to this delivery?: Yes How long did the patient breastfeed?: 1 1/2 yrs. just stopped BF 1/2 through this baby's pregnancy  Feeding    LATCH Score                    Lactation Tools Discussed/Used    Interventions    Discharge    Consult Status Consult Status: Complete    Jaziah Kwasnik G 04/30/2023, 2:36 AM

## 2023-04-30 NOTE — Lactation Note (Signed)
This note was copied from a baby's chart. Lactation Consultation Note  Patient Name: Boy Graison Maczko Today's Date: 04/30/2023 Age:20 hours Reason for consult: Early term 37-38.6wks Checking on mom to see if she needed anything or had any questions. Mom had requested a DEBP earlier today. Wanted to make sure she got it. RN set up pump. Mom stated she is good.  Maternal Data    Feeding    LATCH Score                    Lactation Tools Discussed/Used Tools: Pump Flange Size: 24 Breast pump type: Double-Electric Breast Pump Pump Education:  (RN set up) Reason for Pumping: mom requested Pumping frequency: q3 hours  Interventions    Discharge    Consult Status Consult Status: Complete    Zyan Coby G 04/30/2023, 7:58 PM

## 2023-04-30 NOTE — Social Work (Signed)
[name] was referred for history of depression/anxiety.  * Referral screened out by Clinical Social Worker because none of the following criteria appear to apply:  ~ History of anxiety/depression during this pregnancy, or of post-partum depression following prior delivery.  ~ Diagnosis of anxiety and/or depression within last 3 years OR * [name]'s symptoms currently being treated with medication and/or therapy.  Per chart review [name] denied x of Anxiety, per OB record no MH concerns during this pregnancy. [place]=0  Please contact the Clinical Social Worker if needs arise, or by [name] request.  Wende Neighbors, LCSWA Clinical Social Worker [phone]

## 2023-05-01 LAB — CBC
HCT: 36.9 % (ref 36.0–46.0)
Hemoglobin: 11.6 g/dL — ABNORMAL LOW (ref 12.0–15.0)
MCH: 25.4 pg — ABNORMAL LOW (ref 26.0–34.0)
MCHC: 31.4 g/dL (ref 30.0–36.0)
MCV: 80.9 fL (ref 80.0–100.0)
Platelets: 248 10*3/uL (ref 150–400)
RBC: 4.56 MIL/uL (ref 3.87–5.11)
RDW: 15.4 % (ref 11.5–15.5)
WBC: 13.8 10*3/uL — ABNORMAL HIGH (ref 4.0–10.5)
nRBC: 0 % (ref 0.0–0.2)

## 2023-05-01 MED ORDER — IBUPROFEN 600 MG PO TABS: 600.0000 mg | ORAL_TABLET | Freq: Four times a day (QID) | ORAL | 0 refills | Status: DC | PRN

## 2023-05-01 NOTE — Discharge Instructions (Signed)
Continue to check your blood pressures twice a day Call the office for blood pressures that are consistently above 140 for the top number or 90 for the bottom number   Hypertension During Pregnancy Hypertension is also called high blood pressure. High blood pressure means that the force of your blood moving in your body is too strong. It can cause problems for you and your baby. Different types of high blood pressure can happen during pregnancy. The types are: High blood pressure before you got pregnant. This is called chronic hypertension.  This can continue during your pregnancy. Your doctor will want to keep checking your blood pressure. You may need medicine to keep your blood pressure under control while you are pregnant. You will need follow-up visits after you have your baby. High blood pressure that goes up during pregnancy when it was normal before. This is called gestational hypertension. It will usually get better after you have your baby, but your doctor will need to watch your blood pressure to make sure that it is getting better. Very high blood pressure during pregnancy. This is called preeclampsia. Very high blood pressure is an emergency that needs to be checked and treated right away. You may develop very high blood pressure after giving birth. This is called postpartum preeclampsia. This usually occurs within 48 hours after childbirth but may occur up to 6 weeks after giving birth. This is rare. How does this affect me? If you have high blood pressure during pregnancy, you have a higher chance of developing high blood pressure: As you get older. If you get pregnant again. In some cases, high blood pressure during pregnancy can cause: Stroke. Heart attack. Damage to the kidneys, lungs, or liver. Preeclampsia. Jerky movements you cannot control (convulsions or seizures). Problems with the placenta.  What can I do to lower my risk?  Keep a healthy weight. Eat a healthy  diet. Follow what your doctor tells you about treating any medical problems that you had before becoming pregnant. It is very important to go to all of your doctor visits. Your doctor will check your blood pressure and make sure that your pregnancy is progressing as it should. Treatment should start early if a problem is found.  Follow these instructions at home:  Take your blood pressure 1-2 times per day. Call the office if your blood pressure is 155 or higher for the top number or 105 or higher for the bottom number.    Eating and drinking  Drink enough fluid to keep your pee (urine) pale yellow. Avoid caffeine. Lifestyle Do not use any products that contain nicotine or tobacco, such as cigarettes, e-cigarettes, and chewing tobacco. If you need help quitting, ask your doctor. Do not use alcohol or drugs. Avoid stress. Rest and get plenty of sleep. Regular exercise can help. Ask your doctor what kinds of exercise are best for you. General instructions Take over-the-counter and prescription medicines only as told by your doctor. Keep all prenatal and follow-up visits as told by your doctor. This is important. Contact a doctor if: You have symptoms that your doctor told you to watch for, such as: Headaches. Nausea. Vomiting. Belly (abdominal) pain. Dizziness. Light-headedness. Get help right away if: You have: Very bad belly pain that does not get better with treatment. A very bad headache that does not get better. Vomiting that does not get better. Sudden, fast weight gain. Sudden swelling in your hands, ankles, or face. Blood in your pee. Blurry vision. Double vision.  Shortness of breath. Chest pain. Weakness on one side of your body. Trouble talking. Summary High blood pressure is also called hypertension. High blood pressure means that the force of your blood moving in your body is too strong. High blood pressure can cause problems for you and your baby. Keep all  follow-up visits as told by your doctor. This is important. This information is not intended to replace advice given to you by your health care provider. Make sure you discuss any questions you have with your health care provider. Document Released: 06/06/2010 Document Revised: 08/25/2018 Document Reviewed: 05/31/2018 Elsevier Patient Education  2020 [employer].

## 2023-05-02 LAB — CULTURE, BETA STREP (GROUP B ONLY): Strep Gp B Culture: NEGATIVE

## 2023-05-04 ENCOUNTER — Telehealth: Payer: Self-pay | Admitting: *Deleted

## 2023-05-04 NOTE — Telephone Encounter (Signed)
Pt called to office with what seems to be possible clogged breast duct.  Pt advised she may try warm compresses and gentle massage to the area when pumping/feeding.  Pt advised if no change let us know at her nurse visit this week and provider can examine at that time.

## 2023-05-07 ENCOUNTER — Ambulatory Visit (INDEPENDENT_AMBULATORY_CARE_PROVIDER_SITE_OTHER): Payer: Medicaid Other

## 2023-05-07 VITALS — BP 122/83 | HR 91 | Temp 87.0°F | Wt 145.0 lb

## 2023-05-07 DIAGNOSIS — Z0130 Encounter for examination of blood pressure without abnormal findings: Secondary | ICD-10-CM

## 2023-05-07 NOTE — Progress Notes (Signed)
..  Subjective:  Jodi Mckenzie is a 22 y.o. female here for BP check.   Hypertension ROS: no TIA's, no chest pain on exertion, no dyspnea on exertion, and no swelling of ankles.    Objective:  BP 122/83   Pulse 91   Temp (!) 87 F (30.6 C)   Wt 145 lb (65.8 kg)   LMP 07/28/2022 (Approximate)   BMI 24.13 kg/m   Appearance alert, well appearing, and in no distress. General exam BP noted to be well controlled today in office.    Assessment:   Blood Pressure well controlled.   Plan:  Current treatment plan is effective, no change in therapy. Advised of abnormal symptoms and to call the office or report to mau if they occur. Pt has pp visit scheduled for 05/26/22.

## 2023-05-13 IMAGING — CR DG CERVICAL SPINE COMPLETE 4+V
6 series · 6 of 6 positions shown · non-contrast
Comparison: None.

CLINICAL DATA: Hyperflexion injury, neck pain

EXAM:
CERVICAL SPINE - COMPLETE 4+ VIEW

[w cervical spine lat]
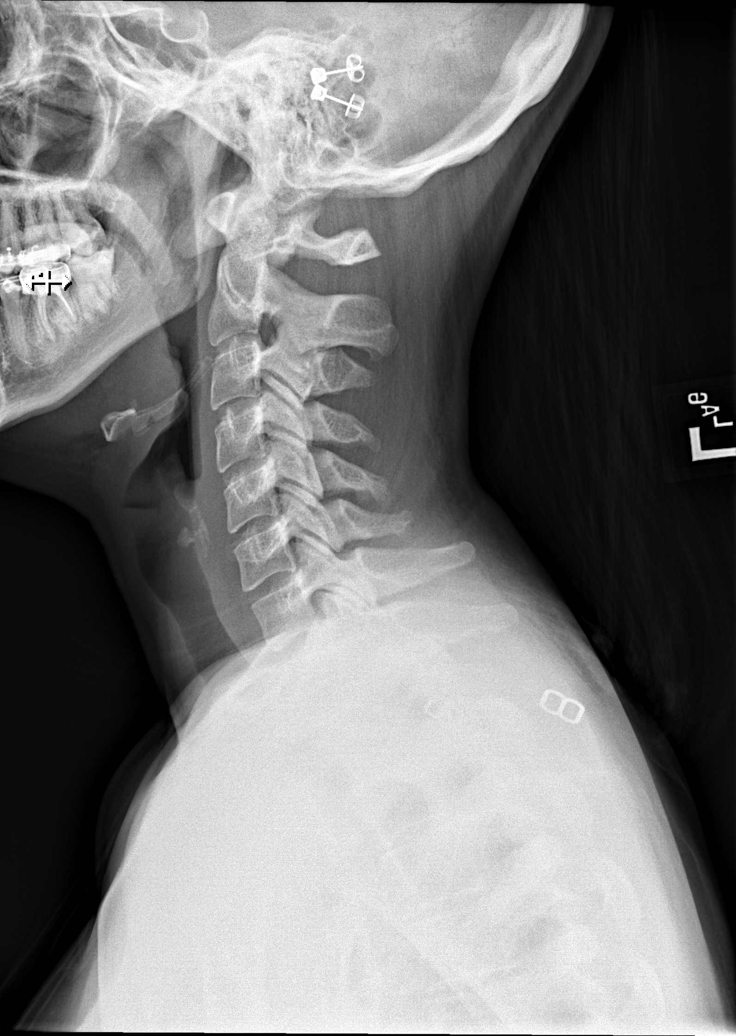

[w cervical spine ap_obl (1 of 2)]
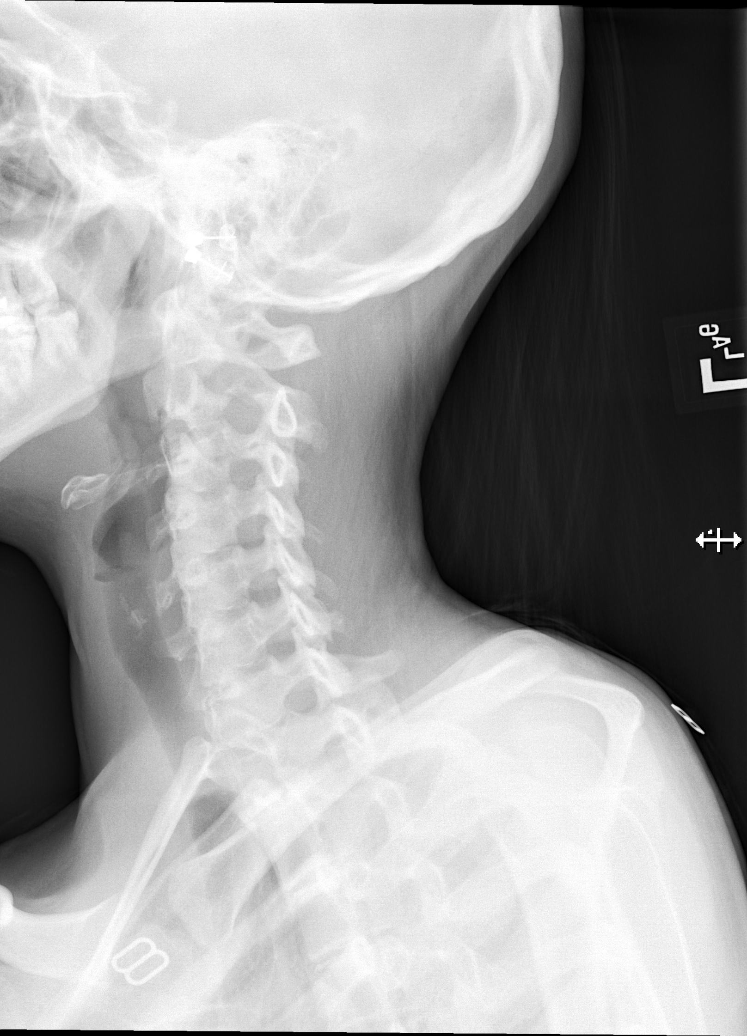

[w cervical spine ap_obl (2 of 2)]
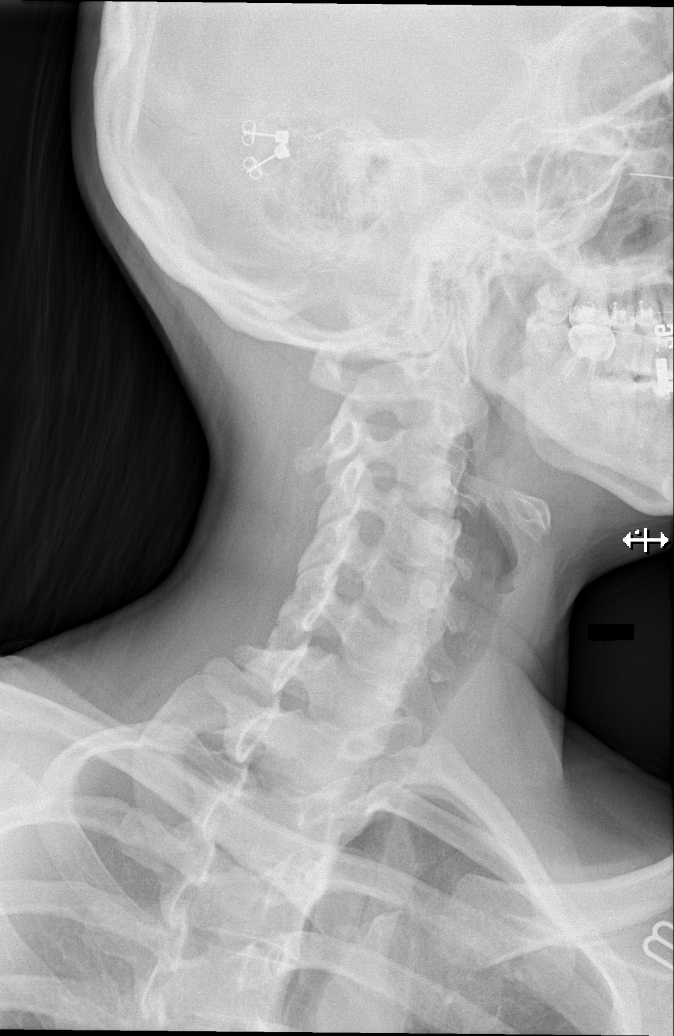

[w cervical spine ap]
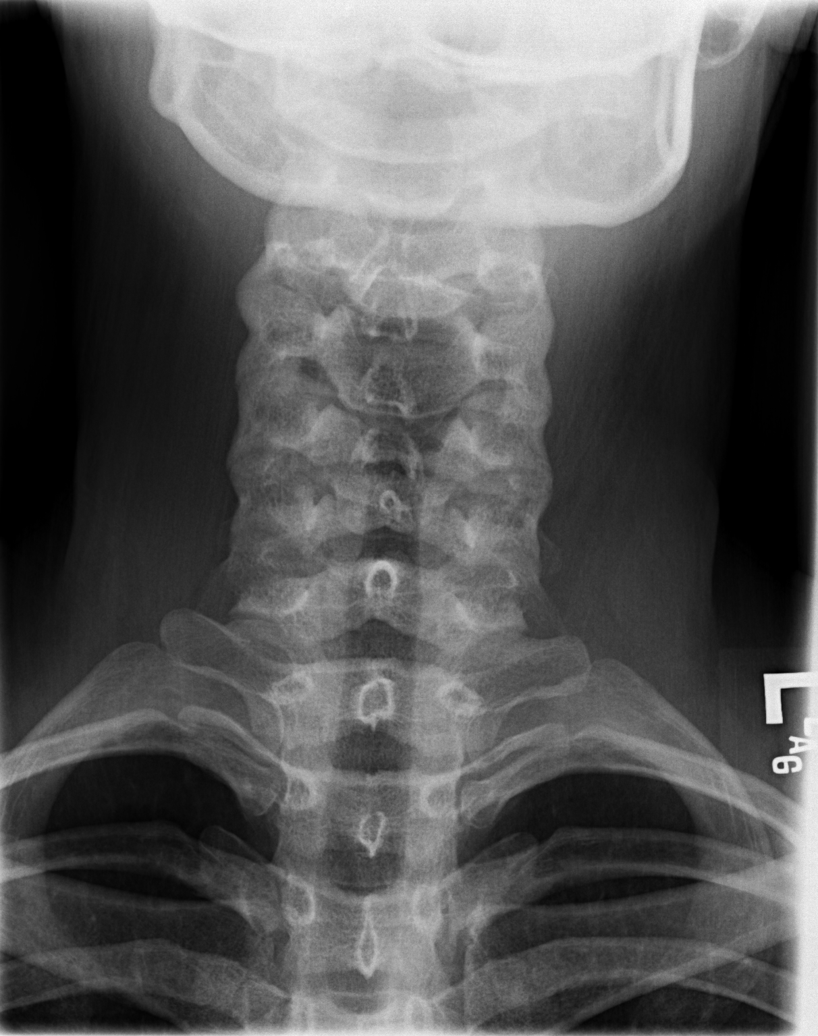

[w cervical spine odontoid]
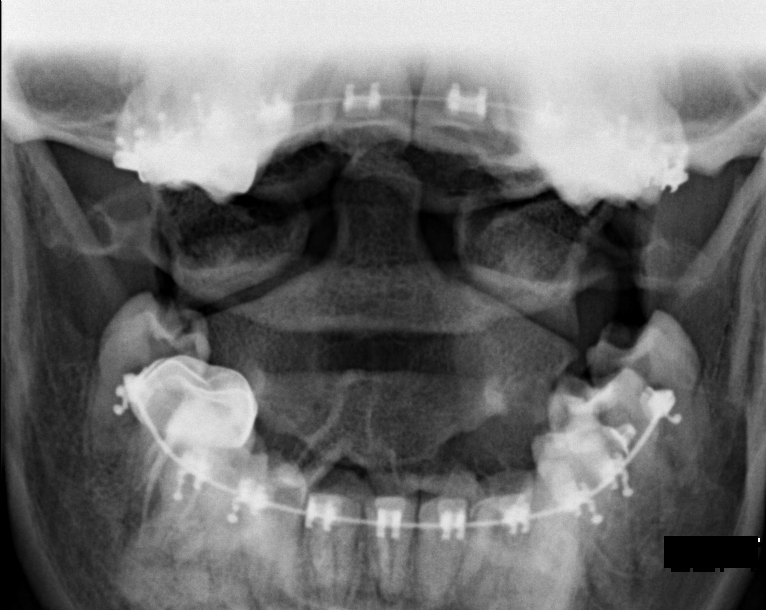

[w cervical swimmers]
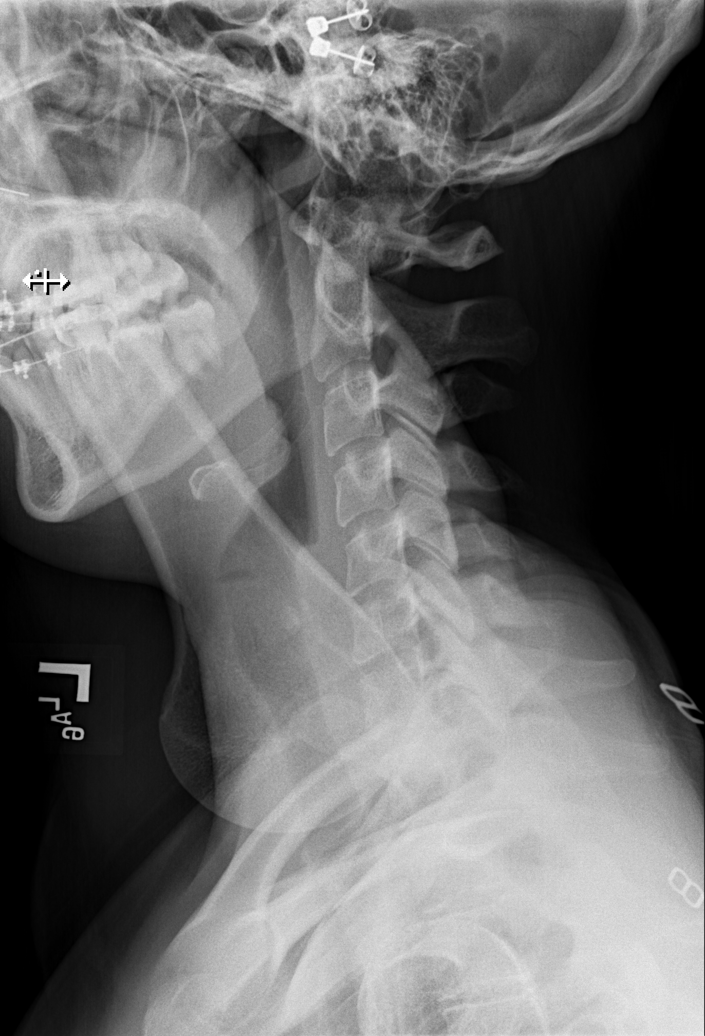

[6 of 6 positions shown; findings below may reference images not displayed]

FINDINGS: There is no evidence of cervical spine fracture or prevertebral soft
tissue swelling. Alignment is normal. No other significant bone
abnormalities are identified.
IMPRESSION: Negative cervical spine radiographs.

## 2023-05-19 ENCOUNTER — Other Ambulatory Visit: Payer: Self-pay

## 2023-05-19 ENCOUNTER — Encounter (HOSPITAL_COMMUNITY): Payer: Self-pay | Admitting: Obstetrics and Gynecology

## 2023-05-19 ENCOUNTER — Inpatient Hospital Stay (HOSPITAL_COMMUNITY)
Admission: AD | Admit: 2023-05-19 | Discharge: 2023-05-19 | Disposition: A | Discharge status: Home / Self Care | Payer: Medicaid Other | Attending: Obstetrics and Gynecology | Admitting: Obstetrics and Gynecology

## 2023-05-19 DIAGNOSIS — O8622 Infection of bladder following delivery: Secondary | ICD-10-CM | POA: Insufficient documentation

## 2023-05-19 DIAGNOSIS — N3001 Acute cystitis with hematuria: Secondary | ICD-10-CM | POA: Diagnosis not present

## 2023-05-19 DIAGNOSIS — R109 Unspecified abdominal pain: Secondary | ICD-10-CM | POA: Diagnosis present

## 2023-05-19 LAB — URINALYSIS, ROUTINE W REFLEX MICROSCOPIC
Bacteria, UA: NONE SEEN
Bilirubin Urine: NEGATIVE
Glucose, UA: NEGATIVE mg/dL
Ketones, ur: NEGATIVE mg/dL
Nitrite: NEGATIVE
Protein, ur: NEGATIVE mg/dL
Specific Gravity, Urine: 1.02 (ref 1.005–1.030)
pH: 6 (ref 5.0–8.0)

## 2023-05-19 LAB — CBC WITH DIFFERENTIAL/PLATELET
Abs Immature Granulocytes: 0.01 10*3/uL (ref 0.00–0.07)
Basophils Absolute: 0.1 10*3/uL (ref 0.0–0.1)
Basophils Relative: 1 %
Eosinophils Absolute: 0.3 10*3/uL (ref 0.0–0.5)
Eosinophils Relative: 5 %
HCT: 43.5 % (ref 36.0–46.0)
Hemoglobin: 14 g/dL (ref 12.0–15.0)
Immature Granulocytes: 0 %
Lymphocytes Relative: 37 %
Lymphs Abs: 2.4 10*3/uL (ref 0.7–4.0)
MCH: 26.1 pg (ref 26.0–34.0)
MCHC: 32.2 g/dL (ref 30.0–36.0)
MCV: 81 fL (ref 80.0–100.0)
Monocytes Absolute: 0.4 10*3/uL (ref 0.1–1.0)
Monocytes Relative: 6 %
Neutro Abs: 3.3 10*3/uL (ref 1.7–7.7)
Neutrophils Relative %: 51 %
Platelets: 313 10*3/uL (ref 150–400)
RBC: 5.37 MIL/uL — ABNORMAL HIGH (ref 3.87–5.11)
RDW: 17.2 % — ABNORMAL HIGH (ref 11.5–15.5)
WBC: 6.6 10*3/uL (ref 4.0–10.5)
nRBC: 0 % (ref 0.0–0.2)

## 2023-05-19 MED ORDER — CEFADROXIL 500 MG PO CAPS: 500.0000 mg | ORAL_CAPSULE | Freq: Two times a day (BID) | ORAL | 0 refills | Status: DC

## 2023-05-19 NOTE — MAU Provider Note (Signed)
 History     CSN: [id number]  Arrival date and time: 05/19/23 1604   Event Date/Time   First Provider Initiated Contact with Patient 05/19/23 1647      Chief Complaint  Patient presents with   Abdominal Pain   HPI  Jodi Mckenzie is a 23 y.o. [id number] postpartum from a vaginal delivery on 12/12 who presents for evaluation of cramping. Patient reports she is having intermittent cramping everyday. She is concerned she has appendicitis. She denies any fever, nausea or vomiting. She reports the pain comes and goes and it helps when she passes gas. She denies any pain at this time.  She reports she is going a couple days between bowel movements which is not normal for her. She reports she does not drink water at home.   OB History     Gravida  2   Para  2   Term  2   Preterm      AB      Living  2      SAB      IAB      Ectopic      Multiple  0   Live Births  2           Past Medical History:  Diagnosis Date   GERD (gastroesophageal reflux disease)     Past Surgical History:  Procedure Laterality Date   NO PAST SURGERIES      Family History  Problem Relation Age of Onset   Thyroid  disease Mother    Diabetes Father     Social History   Tobacco Use   Smoking status: Never   Smokeless tobacco: Never  Vaping Use   Vaping status: Never Used  Substance Use Topics   Alcohol use: Never   Drug use: Never    Allergies:  Allergies  Allergen Reactions   Latex Itching and Hives    Medications Prior to Admission  Medication Sig Dispense Refill Last Dose/Taking   prenatal vitamin w/FE, FA (PRENATAL 1 + 1) 27-1 MG TABS tablet Take 1 tablet by mouth daily at 12 noon.   05/19/2023   Blood Pressure Monitoring (BLOOD PRESSURE KIT) DEVI 1 kit by Does not apply route once a week. (Patient not taking: Reported on 05/07/2023) 1 each 0    cyclobenzaprine  (FLEXERIL ) 5 MG tablet Take 1 tablet (5 mg total) by mouth every 8 (eight) hours as needed for muscle  spasms. (Patient not taking: Reported on 05/07/2023) 30 tablet 0    famotidine  (PEPCID ) 20 MG tablet Take 1 tablet (20 mg total) by mouth 2 (two) times daily. (Patient not taking: Reported on 05/07/2023) 60 tablet 3    ibuprofen  (ADVIL ) 600 MG tablet Take 1 tablet (600 mg total) by mouth every 6 (six) hours as needed. (Patient not taking: Reported on 05/07/2023) 30 tablet 0    Iron, Ferrous Sulfate, 325 (65 Fe) MG TABS Take by mouth. (Patient not taking: Reported on 05/07/2023)      Misc. Devices (GOJJI WEIGHT SCALE) MISC 1 Device by Does not apply route every 30 (thirty) days. (Patient not taking: Reported on 05/07/2023) 1 each 0     Review of Systems  Constitutional: Negative.  Negative for fatigue and fever.  HENT: Negative.    Respiratory: Negative.  Negative for shortness of breath.   Cardiovascular: Negative.  Negative for chest pain.  Gastrointestinal: Negative.  Negative for abdominal pain, constipation, diarrhea, nausea and vomiting.  Genitourinary: Negative.  Negative for dysuria.  Neurological: Negative.  Negative for dizziness and headaches.   Physical Exam   Blood pressure 122/73, pulse 87, temperature 98.2 F (36.8 C), resp. rate 12, last menstrual period 07/28/2022, SpO2 99%, unknown if currently breastfeeding.  Patient Vitals for the past 24 hrs:  BP Temp Pulse Resp SpO2  05/19/23 1633 122/73 -- 87 -- --  05/19/23 1621 113/84 98.2 F (36.8 C) 99 12 99 %    Physical Exam Vitals and nursing note reviewed.  Constitutional:      General: She is not in acute distress.    Appearance: She is well-developed.  HENT:     Head: Normocephalic.  Eyes:     Pupils: Pupils are equal, round, and reactive to light.  Cardiovascular:     Rate and Rhythm: Normal rate and regular rhythm.     Heart sounds: Normal heart sounds.  Pulmonary:     Effort: Pulmonary effort is normal. No respiratory distress.     Breath sounds: Normal breath sounds.  Abdominal:     General: Bowel sounds  are normal. There is no distension.     Palpations: Abdomen is soft.     Tenderness: There is no abdominal tenderness.  Skin:    General: Skin is warm and dry.  Neurological:     Mental Status: She is alert and oriented to person, place, and time.  Psychiatric:        Mood and Affect: Mood normal.        Behavior: Behavior normal.        Thought Content: Thought content normal.        Judgment: Judgment normal.     MAU Course  Procedures  Results for orders placed or performed during the hospital encounter of 05/19/23 (from the past 24 hours)  CBC with Differential/Platelet     Status: Abnormal   Collection Time: 05/19/23  4:58 PM  Result Value Ref Range   WBC 6.6 4.0 - 10.5 K/uL   RBC 5.37 (H) 3.87 - 5.11 MIL/uL   Hemoglobin 14.0 12.0 - 15.0 g/dL   HCT 56.4 63.9 - 53.9 %   MCV 81.0 80.0 - 100.0 fL   MCH 26.1 26.0 - 34.0 pg   MCHC 32.2 30.0 - 36.0 g/dL   RDW 82.7 (H) 88.4 - 84.4 %   Platelets 313 150 - 400 K/uL   nRBC 0.0 0.0 - 0.2 %   Neutrophils Relative % 51 %   Neutro Abs 3.3 1.7 - 7.7 K/uL   Lymphocytes Relative 37 %   Lymphs Abs 2.4 0.7 - 4.0 K/uL   Monocytes Relative 6 %   Monocytes Absolute 0.4 0.1 - 1.0 K/uL   Eosinophils Relative 5 %   Eosinophils Absolute 0.3 0.0 - 0.5 K/uL   Basophils Relative 1 %   Basophils Absolute 0.1 0.0 - 0.1 K/uL   Immature Granulocytes 0 %   Abs Immature Granulocytes 0.01 0.00 - 0.07 K/uL  Urinalysis, Routine w reflex microscopic -Urine, Clean Catch     Status: Abnormal   Collection Time: 05/19/23  5:00 PM  Result Value Ref Range   Color, Urine YELLOW YELLOW   APPearance CLEAR CLEAR   Specific Gravity, Urine 1.020 1.005 - 1.030   pH 6.0 5.0 - 8.0   Glucose, UA NEGATIVE NEGATIVE mg/dL   Hgb urine dipstick MODERATE (A) NEGATIVE   Bilirubin Urine NEGATIVE NEGATIVE   Ketones, ur NEGATIVE NEGATIVE mg/dL   Protein, ur NEGATIVE NEGATIVE mg/dL   Nitrite NEGATIVE NEGATIVE  Leukocytes,Ua LARGE (A) NEGATIVE   RBC / HPF 21-50 0 - 5  RBC/hpf   WBC, UA 21-50 0 - 5 WBC/hpf   Bacteria, UA NONE SEEN NONE SEEN   Squamous Epithelial / HPF 0-5 0 - 5 /HPF   Mucus PRESENT     MDM Labs ordered and reviewed.   UA, UC CBC with Diff  Low suspicion for appendicitis due to no current pain and absence of fever, leukocytosis and GI complaints.   Encouraged significant increase of PO hydration to help with symptoms.   Assessment and Plan   1. Acute cystitis with hematuria   2. Postpartum state     -Discharge home in stable condition -Rx for duricef -Postpartum precautions discussed -Patient advised to follow-up with OB as scheduled for postpartum care -Patient may return to MAU as needed or if her condition were to change or worsen  Aleck CHRISTELLA Fireman, CNM 05/19/2023, 4:48 PM

## 2023-05-19 NOTE — MAU Note (Signed)
.  Jodi Mckenzie is a 23 y.o. at [redacted]w[redacted]d here in MAU reporting: Pt reports for the last 3 days she has had abdominal cramping. Pt reports headaches that are relieved by tylenol , but they are frequent   Onset of complaint: 3 days Pain score: 5/10 There were no vitals filed for this visit.    Lab orders placed from triage:   ua

## 2023-05-20 LAB — CULTURE, OB URINE: Culture: NO GROWTH

## 2023-05-27 ENCOUNTER — Encounter: Payer: Self-pay | Admitting: Obstetrics and Gynecology

## 2023-05-27 ENCOUNTER — Ambulatory Visit (INDEPENDENT_AMBULATORY_CARE_PROVIDER_SITE_OTHER): Payer: Medicaid Other | Admitting: Obstetrics and Gynecology

## 2023-05-27 ENCOUNTER — Other Ambulatory Visit (HOSPITAL_COMMUNITY)
Admission: RE | Admit: 2023-05-27 | Discharge: 2023-05-27 | Disposition: A | Discharge status: Home / Self Care | Payer: Medicaid Other | Source: Ambulatory Visit | Attending: Obstetrics and Gynecology | Admitting: Obstetrics and Gynecology

## 2023-05-27 DIAGNOSIS — R300 Dysuria: Secondary | ICD-10-CM | POA: Diagnosis not present

## 2023-05-27 NOTE — Progress Notes (Signed)
 Post Partum Visit Note  Jodi Mckenzie is a 23 y.o. [id number] female who presents for a postpartum visit. She is 4 weeks postpartum following a normal spontaneous vaginal delivery.  I have fully reviewed the prenatal and intrapartum course. The delivery was at 38 gestational weeks.  Anesthesia: none. Postpartum course has been challenging. Pt reports vaginal pain. Baby is doing well. Baby is feeding by breast. Bleeding no bleeding. Bowel function is  constipation . Bladder function is  decrease urinary sensation  . Patient is not sexually active. Contraception method is none. Postpartum depression screening: negative.  Notes she has a toddler at home which made it impossible to rest in the postpartum period. Pelvic pain has not improved by much yet. Notes that she does not have the urge to urinate unless her bladder is very full. Was treated for UTI 1/1, and TOC today negative.  The pregnancy intention screening data noted above was reviewed. Potential methods of contraception were discussed. The patient elected to proceed with No data recorded.   Edinburgh Postnatal Depression Scale - 05/27/23 1608       Edinburgh Postnatal Depression Scale:  In the Past 7 Days   I have been able to laugh and see the funny side of things. 0    I have looked forward with enjoyment to things. 0    I have blamed myself unnecessarily when things went wrong. 0    I have been anxious or worried for no good reason. 0    I have felt scared or panicky for no good reason. 0    Things have been getting on top of me. 0    I have been so unhappy that I have had difficulty sleeping. 0    I have felt sad or miserable. 0    I have been so unhappy that I have been crying. 0    The thought of harming myself has occurred to me. 0    Edinburgh Postnatal Depression Scale Total 0             Health Maintenance Due  Topic Date Due   COVID-19 Vaccine (1) Never done   HPV VACCINES (1 - Risk 3-dose series) Never done    Cervical Cancer Screening (Pap smear)  Never done    The following portions of the patient's history were reviewed and updated as appropriate: allergies, current medications, past family history, past medical history, past social history, past surgical history, and problem list.  Review of Systems Pertinent items are noted in HPI.  Objective:  BP 124/79   Pulse 65   Ht 5' 5 (1.651 m)   Wt 141 lb 10.4 oz (64.3 kg)   LMP 07/28/2022 (Approximate)   Breastfeeding Yes   BMI 23.57 kg/m    General:  alert, cooperative, and appears stated age   Breasts:  not indicated  Lungs: clear to auscultation bilaterally  Heart:  regular rate and rhythm, S1, S2 normal, no murmur, click, rub or gallop  Abdomen: soft, non-tender; bowel sounds normal; no masses,  no organomegaly   GU exam:   Normal external genitalia. Sutures still in place posterior vagina. Cervix, vagina otherwise wnl. No abnormal discharge.       Assessment:   1. Postpartum care following vaginal delivery (Primary) PP pap obtained today. Still deciding on PP contraception. Will return in 2-3 weeks to discuss. - Cytology - PAP( [place]) - Cervicovaginal ancillary only( [place])  2. Dysuria Feeling both burning with  urination and little urge to urinate. No signs of laceration infection or complication. - UA this AM without signs of infection, so did not repeat - No abnormal healing on speculum exam. Some suture in place; patient notes this is not where her pain is - Discussed bladder training for the next few weeks. To schedule urination every 3-4 hours to help with sensation and prevent dysuria - Vaginal swabs also obtained today - Encouraged resting to allow for full healing  Plan:   Essential components of care per ACOG recommendations:  1.  Mood and well being: Patient with negative depression screening today. Reviewed local resources for support.  - Patient tobacco use? No.   - hx of drug use? No.    2.  Infant care and feeding:  -Patient currently breastmilk feeding? Yes. Reviewed importance of draining breast regularly to support lactation.  -Social determinants of health (SDOH) reviewed in EPIC. No concerns  3. Sexuality, contraception and birth spacing - Patient does not want a pregnancy in the next year. Does want more children in the future - Reviewed reproductive life planning. Reviewed contraceptive methods based on pt preferences and effectiveness. Unsure of contraception at this time. Will follow-up in 2 weeks - Discussed birth spacing of 18 months  4. Sleep and fatigue -Encouraged family/partner/community support of 4 hrs of uninterrupted sleep to help with mood and fatigue  5. Physical Recovery  - Discussed patients delivery and complications. She describes her labor as good. - Patient had a Vaginal, no problems at delivery. Patient had a 2nd degree laceration. Perineal healing reviewed. Patient expressed understanding - Patient has urinary incontinence? No. - Patient is not safe to resume physical and sexual activity  6.  Health Maintenance - HM due items addressed Yes - Last pap smear No results found for: DIAGPAP Pap smear done at today's visit.  -Breast Cancer screening indicated? No.   7. Chronic Disease/Pregnancy Condition follow up: None  - PCP follow up  Jodi CHRISTELLA Moats, MD Center for [facility], [facility]

## 2023-05-28 ENCOUNTER — Ambulatory Visit: Payer: Medicaid Other | Admitting: Cardiology

## 2023-05-31 LAB — CERVICOVAGINAL ANCILLARY ONLY
Bacterial Vaginitis (gardnerella): NEGATIVE
Candida Glabrata: NEGATIVE
Candida Vaginitis: NEGATIVE
Chlamydia: NEGATIVE
Comment: NEGATIVE
Comment: NEGATIVE
Comment: NEGATIVE
Comment: NEGATIVE
Comment: NEGATIVE
Comment: NORMAL
Neisseria Gonorrhea: NEGATIVE
Trichomonas: NEGATIVE

## 2023-05-31 LAB — CYTOLOGY - PAP: Diagnosis: NEGATIVE

## 2023-06-04 ENCOUNTER — Emergency Department (HOSPITAL_COMMUNITY)
Admission: EM | Admit: 2023-06-04 | Discharge: 2023-06-04 | Discharge status: Against Medical Advice | Payer: Medicaid Other | Attending: Emergency Medicine | Admitting: Emergency Medicine

## 2023-06-04 ENCOUNTER — Other Ambulatory Visit: Payer: Self-pay

## 2023-06-04 DIAGNOSIS — Z5321 Procedure and treatment not carried out due to patient leaving prior to being seen by health care provider: Secondary | ICD-10-CM | POA: Insufficient documentation

## 2023-06-04 DIAGNOSIS — R1030 Lower abdominal pain, unspecified: Secondary | ICD-10-CM | POA: Diagnosis present

## 2023-06-04 LAB — CBC
HCT: 44.3 % (ref 36.0–46.0)
Hemoglobin: 13.9 g/dL (ref 12.0–15.0)
MCH: 26.1 pg (ref 26.0–34.0)
MCHC: 31.4 g/dL (ref 30.0–36.0)
MCV: 83.1 fL (ref 80.0–100.0)
Platelets: 264 10*3/uL (ref 150–400)
RBC: 5.33 MIL/uL — ABNORMAL HIGH (ref 3.87–5.11)
RDW: 18.5 % — ABNORMAL HIGH (ref 11.5–15.5)
WBC: 9.2 10*3/uL (ref 4.0–10.5)
nRBC: 0 % (ref 0.0–0.2)

## 2023-06-04 LAB — COMPREHENSIVE METABOLIC PANEL
ALT: 21 U/L (ref 0–44)
AST: 17 U/L (ref 15–41)
Albumin: 4 g/dL (ref 3.5–5.0)
Alkaline Phosphatase: 87 U/L (ref 38–126)
Anion gap: 7 (ref 5–15)
BUN: 9 mg/dL (ref 6–20)
CO2: 26 mmol/L (ref 22–32)
Calcium: 9.9 mg/dL (ref 8.9–10.3)
Chloride: 107 mmol/L (ref 98–111)
Creatinine, Ser: 0.57 mg/dL (ref 0.44–1.00)
GFR, Estimated: >60 mL/min (ref >60)
Glucose, Bld: 104 mg/dL — ABNORMAL HIGH (ref 70–99)
Potassium: 4 mmol/L (ref 3.5–5.1)
Sodium: 140 mmol/L (ref 135–145)
Total Bilirubin: 0.5 mg/dL (ref 0.0–1.2)
Total Protein: 7.5 g/dL (ref 6.5–8.1)

## 2023-06-04 LAB — URINALYSIS, ROUTINE W REFLEX MICROSCOPIC
Bilirubin Urine: NEGATIVE
Glucose, UA: NEGATIVE mg/dL
Hgb urine dipstick: NEGATIVE
Ketones, ur: NEGATIVE mg/dL
Leukocytes,Ua: NEGATIVE
Nitrite: NEGATIVE
Protein, ur: NEGATIVE mg/dL
Specific Gravity, Urine: 1.02 (ref 1.005–1.030)
pH: 5 (ref 5.0–8.0)

## 2023-06-04 LAB — HCG, SERUM, QUALITATIVE: Preg, Serum: NEGATIVE

## 2023-06-04 LAB — LIPASE, BLOOD: Lipase: 31 U/L (ref 11–51)

## 2023-06-04 NOTE — ED Triage Notes (Addendum)
Patient reports pain across lower abdomen onset 2 weeks ago , denies emesis or diarrhea , no fever or chills . Vaginal delivery last 04/29/2023.

## 2023-06-04 NOTE — ED Notes (Signed)
Pt advised she was going to leave the ED. OTF

## 2023-06-09 ENCOUNTER — Encounter: Payer: Self-pay | Admitting: Obstetrics and Gynecology

## 2023-06-09 NOTE — Progress Notes (Unsigned)
   RETURN GYNECOLOGY VISIT  Subjective:  Jodi Mckenzie is a 23 y.o. [id number] who is recently postpartum (04/29/23) presenting to discuss contraception.   No contraindications to estrogen [ ]  confirm no migraine w/ aura Pap NILM 05/27/23  Objective:  There were no vitals filed for this visit.  General:  Alert, oriented and cooperative. Patient is in no acute distress.  Skin: Skin is warm and dry. No rash noted.   Cardiovascular: Normal heart rate noted  Respiratory: Normal respiratory effort, no problems with respiration noted  Abdomen: Soft, non-tender, non-distended   Pelvic: NEFG.   Exam performed in the presence of a chaperone  Assessment and Plan:  Jodi Mckenzie is a 23 y.o. with ***  There are no diagnoses linked to this encounter.  No follow-ups on file.  Future Appointments  Date Time Provider Department Center  06/10/2023  8:35 AM Lennart Pall, MD [facility] None  06/24/2023 11:00 AM Earna Coder C, PT [facility] None  07/01/2023 11:45 AM Omar Person, PT [facility] None  07/08/2023 11:45 AM Omar Person, PT [facility] None  07/15/2023 11:45 AM Price, Leodis Binet, PT [facility] None    Lennart Pall, MD

## 2023-06-10 ENCOUNTER — Ambulatory Visit (INDEPENDENT_AMBULATORY_CARE_PROVIDER_SITE_OTHER): Payer: Medicaid Other | Admitting: Obstetrics and Gynecology

## 2023-06-10 VITALS — BP 115/77 | HR 76 | Wt 147.0 lb

## 2023-06-10 DIAGNOSIS — Z3009 Encounter for other general counseling and advice on contraception: Secondary | ICD-10-CM

## 2023-06-10 DIAGNOSIS — Z30017 Encounter for initial prescription of implantable subdermal contraceptive: Secondary | ICD-10-CM

## 2023-06-10 LAB — POCT URINE PREGNANCY: Preg Test, Ur: NEGATIVE

## 2023-06-10 MED ORDER — ETONOGESTREL 68 MG ~~LOC~~ IMPL
68.0000 mg | DRUG_IMPLANT | Freq: Once | SUBCUTANEOUS | Status: AC
  Administered 2023-06-10: 68 mg via SUBCUTANEOUS

## 2023-06-10 NOTE — Addendum Note (Signed)
Addended by: Marya Landry D on: 06/10/2023 09:25 AM   Modules accepted: Orders

## 2023-06-10 NOTE — Progress Notes (Signed)
   GYNECOLOGY OFFICE PROCEDURE NOTE  Jodi Mckenzie is a 23 y.o. [phone] here for Nexplanon insertion.    Nexplanon Insertion Procedure Patient identified, informed consent performed, consent signed.   Patient does understand that irregular bleeding is a very common side effect of this medication. She was advised to have backup contraception for one week after placement. Pregnancy test in clinic today was negative.  Appropriate time out taken.    Patient's left arm was prepped and draped in the usual sterile fashion. The ruler used to measure and mark insertion area.  Patient was prepped with alcohol swab and then injected with 3 ml of 1% lidocaine.  She was prepped with betadine, Nexplanon removed from packaging,  Device confirmed in needle, then inserted full length of needle and withdrawn per handbook instructions. Nexplanon was able to palpated in the patient's arm; patient palpated the insert herself. There was minimal blood loss.  Patient insertion site covered with guaze and a pressure bandage to reduce any bruising.    The patient tolerated the procedure well and was given post procedure instructions.      Harvie Bridge, MD Obstetrician & Gynecologist, [facility] for [employer], [facility]

## 2023-06-24 ENCOUNTER — Ambulatory Visit: Payer: Medicaid Other | Admitting: Physical Therapy

## 2023-07-01 ENCOUNTER — Other Ambulatory Visit: Payer: Self-pay

## 2023-07-01 ENCOUNTER — Ambulatory Visit: Payer: Medicaid Other | Attending: Physician Assistant | Admitting: Physical Therapy

## 2023-07-01 ENCOUNTER — Encounter: Payer: Self-pay | Admitting: Physical Therapy

## 2023-07-01 DIAGNOSIS — M6281 Muscle weakness (generalized): Secondary | ICD-10-CM | POA: Insufficient documentation

## 2023-07-01 DIAGNOSIS — R293 Abnormal posture: Secondary | ICD-10-CM | POA: Diagnosis present

## 2023-07-01 DIAGNOSIS — R279 Unspecified lack of coordination: Secondary | ICD-10-CM | POA: Insufficient documentation

## 2023-07-01 NOTE — Therapy (Signed)
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   PCP: has one, cannot remember at this time   REFERRING PROVIDER: Reubin Milan, PA-C   REFERRING DIAG: R30.0 (ICD-10-CM) - Dysuria M62.89 (ICD-10-CM) - Pelvic floor dysfunction  THERAPY DIAG:  Muscle weakness (generalized)  Unspecified lack of coordination  Abnormal posture  Rationale for Evaluation and Treatment: Rehabilitation  ONSET DATE: 04/2023  SUBJECTIVE:                                                                                                                                                                                           SUBJECTIVE STATEMENT: Patient reports to PT after giving birth 04/29/23 and she thought everything was going well, but now she is realizing she might have jumped into activity too quick postpartum. She is struggling to feel when she needs to void and she has back pain/pelvic pain surrounding bowel movements and voiding. Patient feels a general weakness all over. She is experiencing urinary leakage with stressors and high urge, along with struggling to feel the urge to void. Bowel movements usually relieve pain.   Fluid intake: drinks  ~7 bottles water daily, adds juice to water daily, 3x/day she will add lemon to water   PAIN:  Are you having pain? Yesin the back and pelvis NPRS scale: 5/10 Pain location: Internal, External, Deep, and Bilateral  Pain type: aching and dull Pain description: intermittent and aching   Aggravating factors: urination, standing for a long period of time, cleaning the house   Relieving factors: warm baths, using the bathroom  PRECAUTIONS: None  RED FLAGS: None   WEIGHT BEARING RESTRICTIONS: No  FALLS:  Has patient fallen in last 6 months? No  OCCUPATION: stay  at home mom   ACTIVITY LEVEL : started going to a yoga class   PLOF: Independent  PATIENT GOALS: decrease pain that she feels, normalized bladder habits   PERTINENT HISTORY:  Sexual abuse: No  BOWEL MOVEMENT: Pain with bowel movement: Yes- she feels this pain when she feels her rectum full  Type of bowel movement:Type (Bristol Stool Scale) 2-3, Frequency 0-1, Strain Yes, and Splinting no Fully empty rectum: No Leakage: No Pads: No Fiber supplement/laative No  URINATION: Pain with urination: No Fully empty bladder: Yes: sometimes Stream: Strong Urgency: Yes: sometimes will cause leakage Frequency: 4x/day, goes 3-4x/night  Leakage: Urge to void, Sneezing, Laughing, and Lifting Pads: Yes: liners for leakage- typically wears 3 per day   INTERCOURSE:  Ability to have vaginal penetration No.personal preference due to pain   PREGNANCY: Vaginal deliveries 2 Tearing Yes: 2nd degree tears with both   PROLAPSE: None  OBJECTIVE:  Note: Objective measures were completed at Evaluation unless otherwise noted.  PATIENT SURVEYS: PFIQ-7: 22  COGNITION: Overall cognitive status: Within functional limits for tasks assessed   SENSATION: Light touch: Appears intact  LUMBAR SPECIAL TESTS:  Single leg stance test: Positive  FUNCTIONAL TESTS:  Squat: dynamic bilateral knee valgus, low back pain    GAIT: Comments: mild trendelenburg gait pattern with ambulation  POSTURE: rounded shoulders and forward head   LUMBARAROM/PROM:   A/PROM A/PROM  eval  Flexion 25% limited  Extension 25% limited  Right lateral flexion 25% limited  Left lateral flexion 25% limited  Right rotation 25% limited  Left rotation 25% limited   (Blank rows = not tested)  LOWER EXTREMITY ROM: WNL  Active ROM Right eval Left eval  Hip flexion    Hip extension    Hip abduction    Hip adduction    Hip internal rotation    Hip external rotation    Knee flexion    Knee extension    Ankle dorsiflexion    Ankle plantarflexion    Ankle inversion    Ankle eversion     (Blank rows = not tested)  LOWER EXTREMITY MMT: 4-/5 bilateral hips grossly, 4/5 bilateral knees grossly  MMT Right eval Left eval  Hip flexion    Hip extension    Hip abduction    Hip adduction    Hip internal rotation    Hip external rotation    Knee flexion    Knee extension    Ankle dorsiflexion    Ankle plantarflexion    Ankle inversion    Ankle eversion     (Blank rows = not tested) PALPATION:   General: upper chest breathing, abdominal bracing at rest, mild pain with palpation of left sided transverse perineal body   Pelvic Alignment: WNL  Abdominal: no notable abdominal separation, decreased rib mobility                 External Perineal Exam: mild pain with palpation of left sided transverse perineal body                              Internal Pelvic Floor: general overactivity noted throughout bilateral pelvic floor musculature. Left sided tenderness with palpation of levator ani. Lack of coordination between pelvic floor and diaphragm with inhalation.  Patient confirms identification and approves PT to assess internal pelvic floor and treatment Yes No emotional/communication barriers or cognitive limitation. Patient is motivated to learn. Patient understands and agrees with treatment goals  and plan. PT  explains patient will be examined in standing, sitting, and lying down to see how their muscles and joints work. When they are ready, they will be asked to remove their underwear so PT can examine their perineum. The patient is also given the option of providing their own chaperone as one is not provided in our facility. The patient also has the right and is explained the right to defer or refuse any part of the evaluation or treatment including the internal exam. With the patient's consent, PT will use one gloved finger to gently assess the muscles of the pelvic floor, seeing how well it contracts and relaxes and if there is muscle symmetry. After, the patient will get dressed and PT and patient will discuss exam findings and plan of care. PT and patient discuss plan of care, schedule, attendance policy and HEP activities.  PELVIC MMT:   MMT eval  Vaginal 5/5, 10 quick flicks, 10 sec hold  Internal Anal Sphincter   External Anal Sphincter   Puborectalis   Diastasis Recti No significant abdominal separation noted above/at/below umbilicus  (Blank rows = not tested)     TONE: Generally increased muscle tone throughout bilateral pelvic floor musculature   PROLAPSE: None   TODAY'S TREATMENT:                                                                                                                              DATE:   EVAL 07/01/23: Examination completed, findings reviewed, pt educated on POC, HEP, and manual therapy/neuro re-ed/self care. Pt motivated to participate in PT and agreeable to attempt recommendations.  Manual therapy Internal pelvic floor muscle mobilization and palpation to promote relaxation and blood flow  Internal pelvic floor lengthening cues with inhalation for pelvic floor relaxation   Neuro re-ed  Hooklying diaphragmatic breathing + pelvic floor lengthening on inhalation 2x10  Lower trunk rotation + diaphragmatic breathing  2x90min  Childs pose + diaphragmatic breathing   2x54min  Self care  bladder irritants, toileting mechanics for defecation, voiding schedule every 2 hours, relaxation during voiding and defecation   PATIENT EDUCATION:  Education details: bladder irritants, toileting mechanics for defecation, voiding schedule every 2 hours  Person educated: Patient Education method: Explanation, Demonstration, Tactile cues, Verbal cues, and Handouts Education comprehension: verbalized understanding, returned demonstration, verbal cues required, and tactile cues required  HOME EXERCISE PROGRAM: Access Code: [email or web address] URL: [email or web address] Date: 07/01/2023 Prepared by: Earna Coder  Exercises - Supine Diaphragmatic Breathing  - 1 x daily - 7 x weekly - 2 sets - 10 reps - Supine Lower Trunk Rotation  - 1 x daily - 7 x weekly - 2 sets - 20 reps - Child's Pose Stretch  - 1 x daily - 7 x weekly - 2 sets - hold  ASSESSMENT:  CLINICAL IMPRESSION: Patient is a 23 y.o. female  who was seen today for physical therapy evaluation and treatment for pelvic pain and urinary  dysfunction including pain around bowel/urinary movements and little urgency to urinate. Patient's pain at rest today is 5/10 and located bilaterally throughout the lumbopelvic region, both anteriorly and posteriorly. Patient also experiences mixed urge and stress urinary incontinence. Patient fully consents to today's internal examination, which reveals general overactivity in the pelvic floor musculature and lack of coordination between the pelvic floor and diaphragm during breathing. Palpation of left levator ani muscle reproduced pain in lumbar region. Following manual palpation and diaphragmatic breathing interventions, patient's pain decreased and she was able to actively lengthen her pelvic floor with inhalation. Patient reports full understanding of bladder irritants and voiding schedule to regular bladder function. Patient educated on defecation techniques to relax the  pelvic floor. Pt tolerated treatment well and would benefit from additional PT to further address deficits.   OBJECTIVE IMPAIRMENTS: decreased coordination, decreased endurance, decreased mobility, decreased ROM, decreased strength, and pain.   ACTIVITY LIMITATIONS: continence  PARTICIPATION LIMITATIONS:  ADLs with pain  PERSONAL FACTORS: Past/current experiences are also affecting patient's functional outcome.   REHAB POTENTIAL: Good  CLINICAL DECISION MAKING: Stable/uncomplicated  EVALUATION COMPLEXITY: Low   GOALS: Goals reviewed with patient? Yes  SHORT TERM GOALS: Target date: 07/29/2023  Pt will be independent with HEP.  Baseline: Goal status: INITIAL  2.  Pt will be able to teach back and utilize urge suppression technique in order to help reduce number of trips to the bathroom.   Baseline:  Goal status: INITIAL  3.  Pt will be independent with the knack drill and voiding schedule to improve bladder habits and decrease urinary incontinence.   Baseline:  Goal status: INITIAL  4.  Pt will be independent with use of squatty potty, relaxed toileting mechanics, and improved bowel movement techniques in order to increase ease of bowel movements and complete evacuation.  Baseline:  Goal status: INITIAL  LONG TERM GOALS: Target date: 12/29/2023  Pt will be independent with advanced HEP.  Baseline:  Goal status: INITIAL  2.  Pt will report 75% reduction of pain due to improvements in pelvic floor coordination and control to improve quality of life.  Baseline:  Goal status: INITIAL  3.  Pt will report her BMs are complete due to improved bowel habits and evacuation techniques.  Baseline:  Goal status: INITIAL  4.  Pt will be able to lift at least 10 lb correctly for 10 reps without pain or leakage for functional activities and to improve quality of life.  Baseline:  Goal status: INITIAL  5.  Pt to demonstrate improved coordination of pelvic floor and breathing  mechanics with 10# squat with appropriate synergistic patterns to decrease pain and leakage at least 75% of the time.   Baseline:  Goal status: INITIAL  PLAN:  PT FREQUENCY: 1x/week  PT DURATION: 8 weeks  PLANNED INTERVENTIONS: 97110-Therapeutic exercises, 97530- Therapeutic activity, 97112- Neuromuscular re-education, 97535- Self Care, 16109- Manual therapy, Taping, Dry Needling, Joint mobilization, Spinal mobilization, Scar mobilization, Cryotherapy, and Moist heat  PLAN FOR NEXT SESSION: internal to decrease pain and muscle tension, teach pelvic floor AROM with breathing, more lumbopelvic mobility   Omar Person, PT 07/01/2023, 12:54 PM

## 2023-07-01 NOTE — Patient Instructions (Signed)
Bladder Irritants  Certain foods and beverages can be irritating to the bladder.  Avoiding these irritants may decrease your symptoms of urinary urgency, frequency or bladder pain.  Even reducing your intake can help with your symptoms.  Not everyone is sensitive to all bladder irritants, so you may consider focusing on one irritant at a time, removing or reducing your intake of that irritant for 7-10 days to see if this change helps your symptoms.  Water intake is also very important.  Below is a list of bladder irritants.  Drinks: alcohol, carbonated beverages, caffeinated beverages such as coffee and tea, drinks with artificial sweeteners, citrus juices, apple juice, tomato juice  Foods: tomatoes and tomato based foods, spicy food, sugar and artificial sweeteners, vinegar, chocolate, raw onion, apples, citrus fruits, pineapple, cranberries, tomatoes, strawberries, plums, peaches, cantaloupe  Other: acidic urine (too concentrated) - see water intake info below  Substitutes you can try that are NOT irritating to the bladder: cooked onion, pears, papayas, sun-brewed decaf teas, watermelons, non-citrus herbal teas, apricots, kava and low-acid instant drinks (Postum).    WATER INTAKE: Remember to drink lots of water (aim for fluid intake of half your body weight with 2/3 of fluids being water).  You may be limiting fluids due to fear of leakage, but this can actually worsen urgency symptoms due to highly concentrated urine.  Water helps balance the pH of your urine so it doesn't become too acidic - acidic urine is a bladder irritant!  Toileting Techniques for Bowel Movements (Defecation) Using your belly (abdomen) and pelvic floor muscles to have a bowel movement is usually instinctive.  Sometimes people can have problems with these muscles and have to relearn proper defecation (emptying) techniques.  If you have weakness in your muscles, organs that are falling out, decreased sensation in your  pelvis, or ignore your urge to go, you may find yourself straining to have a bowel movement.  You are straining if you are: holding your breath or taking in a huge gulp of air and holding it  keeping your lips and jaw tensed and closed tightly turning red in the face because of excessive pushing or forcing developing or worsening your  hemorrhoids getting faint while pushing not emptying completely and have to defecate many times a day  If you are straining, you are actually making it harder for yourself to have a bowel movement.  Many people find they are pulling up with the pelvic floor muscles and closing off instead of opening the anus. Due to lack pelvic floor relaxation and coordination the abdominal muscles, one has to work harder to push the feces out.  Many people have never been taught how to defecate efficiently and effectively.  Notice what happens to your body when you are having a bowel movement.  While you are sitting on the toilet pay attention to the following areas: Jaw and mouth position Angle of your hips   Whether your feet touch the ground or not Arm placement  Spine position Waist Belly tension Anus (opening of the anal canal)  An Evacuation/Defecation Plan   Here are the 4 basic points:  Lean forward enough for your elbows to rest on your knees Support your feet on the floor or use a low stool if your feet don't touch the floor  Push out your belly as if you have swallowed a beach ball--you should feel a widening of your waist Open and relax your pelvic floor muscles, rather than tightening around the anus  The following conditions my require modifications to your toileting posture:  If you have had surgery in the past that limits your back, hip, pelvic, knee or ankle flexibility Constipation   Your healthcare practitioner may make the following additional suggestions and adjustments:  Sit on the toilet  a) Make sure your feet are  supported. b) Notice your hip angle and spine position--most people find it effective to lean forward or raise their knees, which can help the muscles around the anus to relax  c) When you lean forward, place your forearms on your thighs for support  Relax suggestions a) Breath deeply in through your nose and out slowly through your mouth as if you are smelling the flowers and blowing out the candles. b) To become aware of how to relax your muscles, contracting and releasing muscles can be helpful.  Pull your pelvic floor muscles in tightly by using the image of holding back gas, or closing around the anus (visualize making a circle smaller) and lifting the anus up and in.  Then release the muscles and your anus should drop down and feel open. Repeat 5 times ending with the feeling of relaxation. c) Keep your pelvic floor muscles relaxed; let your belly bulge out. d) The digestive tract starts at the mouth and ends at the anal opening, so be sure to relax both ends of the tube.  Place your tongue on the roof of your mouth with your teeth separated.  This helps relax your mouth and will help to relax the anus at the same time.  Empty (defecation) a) Keep your pelvic floor and sphincter relaxed, then bulge your anal muscles.  Make the anal opening wide.  b) Stick your belly out as if you have swallowed a beach ball. c) Make your belly wall hard using your belly muscles while continuing to breathe. Doing this makes it easier to open your anus. d) Breath out and give a grunt (or try using other sounds such as ahhhh, shhhhh, ohhhh or grrrrrrr).  4) Finish a) As you finish your bowel movement, pull the pelvic floor muscles up and in.  This will leave your anus in the proper place rather than remaining pushed out and down. If you leave your anus pushed out and down, it will start to feel as though that is normal and give you incorrect signals about needing to have a bowel movement.

## 2023-07-08 ENCOUNTER — Ambulatory Visit: Payer: Medicaid Other | Admitting: Physical Therapy

## 2023-07-15 ENCOUNTER — Ambulatory Visit: Payer: Medicaid Other | Admitting: Physical Therapy

## 2023-07-15 DIAGNOSIS — R293 Abnormal posture: Secondary | ICD-10-CM

## 2023-07-15 DIAGNOSIS — R279 Unspecified lack of coordination: Secondary | ICD-10-CM

## 2023-07-15 DIAGNOSIS — M6281 Muscle weakness (generalized): Secondary | ICD-10-CM

## 2023-07-15 NOTE — Patient Instructions (Signed)
 Strategies for Constipation - Developing a Bowel Routine  Bowels love routine.  Choose the best time of day to have a bowel movement.  Usually the best time of day for a bowel movement in 30 min to 1 hour after breakfast or lunch.  It can take a week or longer to transition to new bowel routines, but if you follow the guidelines below, you will start to retrain your system for more successful bowel routines.  Eat breakfast every morning to try to get things moving along.  Eating stimulates the bowels through turning on the gastrocolic reflex, which are the wave-like contractions of smooth muscle in your intestines to help move feces along.  Adding in a hot beverage with breakfast may help as well.  Think of your digestive tract as being a conveyor belt - new food comes in and pushes along "old food," with the end of the line being a bowel movement.  You may add 1/4 cup or 1/3 cup of prune juice every evening to help stimulate morning bowel function until you see more successful bowel function.  Create routine in both your timing and portions of intake of food and drink throughout the day.  Eat each of your meals at consistent times of the day.  For portions, the size of each meal may vary, but try to eat consistent portions each day at breakfast, lunch and dinner.  For example, you may have a small breakfast every day and a big lunch every day. This is fine if it is a consistent pattern of intake.  With each meal, aim for at least 2 servings of fruits and vegetables and at least one serving of whole grains (whole grain cereal, brown rice, bran, whole wheat or rye bread, oatmeal) to get some fiber in your system.  These foods provide soft bulk for the bowel.  Drink water.  Aim for at least 8 large glasses of water a day.  Water helps the stool stay softer and easier to pass.  If you are adding more fiber in your diet, be sure to increase your water intake too.  Exercise can help move things along the  digestive tract.  Exercise may be a walk, jog, yoga, exercise video, gym time, or even household walking or marching in place at the kitchen countertop.  Pairing exercise and breakfast as part of your morning routine may help establish a morning bowel routine.  Listen to your body's signals that it is time to go to the bathroom!  Our body has automatic reflexes that signal when it is time to have a bowel movement.  If you are establishing routines to help stimulate your bowels, be sure to allow time for toileting.  For example, if you typically have a bowel movement after breakfast, get up early enough to eat AND empty your bowels before your schedule gets too busy or before it becomes inconvenient to access a bathroom.  If we routinely delay or hold back a bowel movement, these reflexes can stop signaling, leading to constipation which may become chronic (long-term).    Ensure proper toileting technique including posture, breathing and emptying strategies.*  Abdominal massage may also help stimulate your bowels and reduce the discomfort that accompanies constipation.*  *Your PT can educate you on toileting strategies and self-massage for your abdomen.  Your PT may also ask you to complete a food log and bowel diary for 1-3 days to help identify patterns and make suggestions for increased success in managing your  symptoms.   A high fiber diet with plenty of fluids (up to 8 glasses of water daily) is suggested to relieve these symptoms.  Metamucil, 1 tablespoon once or twice daily can be used to keep bowels regular if needed.  A high fiber diet with plenty of fluids (up to 8 glasses of water daily) is suggested to relieve these symptoms.  Metamucil, 1 tablespoon once or twice daily can be used to keep bowels regular if needed.

## 2023-07-15 NOTE — Therapy (Signed)
 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   PCP: has one, cannot remember at this time   REFERRING PROVIDER: Reubin Milan, PA-C   REFERRING DIAG: R30.0 (ICD-10-CM) - Dysuria M62.89 (ICD-10-CM) - Pelvic floor dysfunction  THERAPY DIAG:  Muscle weakness (generalized)  Unspecified lack of coordination  Abnormal posture  Rationale for Evaluation and Treatment: Rehabilitation  ONSET DATE: 04/2023  SUBJECTIVE:                                                                                                                                                                                           SUBJECTIVE STATEMENT: Patient reports that she is doing well today. No pain to report today, but she had some pain yesterday in the lower pelvic region, 7/10 yesterday and took Tylenol for this, which helped somewhat. No urinary leakage to report since last visit. Patient has a kids stool at home that she can use for bowel movements to resemble a squatty potty.   Fluid intake: drinks ~7 bottles water daily, adds juice to water daily, 3x/day she will add lemon to water   PAIN:  Are you having pain? Yesin the back and pelvis NPRS  scale: 5/10 Pain location: Internal, External, Deep, and Bilateral  Pain type: aching and dull Pain description: intermittent and aching   Aggravating factors: urination, standing for a long period of time, cleaning the house   Relieving factors: warm baths, using the bathroom  PRECAUTIONS: None  RED FLAGS: None   WEIGHT BEARING RESTRICTIONS: No  FALLS:  Has patient fallen in last 6 months? No  OCCUPATION: stay at home mom   ACTIVITY LEVEL : started going to a yoga class   PLOF: Independent  PATIENT GOALS: decrease pain that she feels, normalized bladder habits   PERTINENT HISTORY:  Sexual abuse: No  BOWEL MOVEMENT: Pain with bowel movement: Yes- she feels this pain when she feels her rectum full  Type of bowel movement:Type (Bristol Stool Scale) 2-3, Frequency 0-1, Strain Yes, and Splinting no Fully empty rectum: No Leakage: No Pads: No Fiber supplement/laative No  URINATION: Pain with urination: No Fully empty bladder: Yes: sometimes Stream: Strong Urgency: Yes: sometimes will cause leakage Frequency: 4x/day, goes 3-4x/night  Leakage: Urge to void, Sneezing, Laughing, and Lifting Pads: Yes: liners for leakage- typically wears 3 per day   INTERCOURSE:  Ability to have vaginal penetration No.personal preference due to pain   PREGNANCY: Vaginal deliveries 2 Tearing Yes: 2nd degree tears with both   PROLAPSE: None  OBJECTIVE:  Note: Objective measures were completed at Evaluation unless otherwise noted.  PATIENT SURVEYS: PFIQ-7: 22  COGNITION: Overall cognitive status: Within functional limits for tasks assessed   SENSATION: Light touch: Appears intact  LUMBAR SPECIAL TESTS:  Single leg stance test: Positive  FUNCTIONAL TESTS:  Squat: dynamic bilateral knee valgus, low back pain   GAIT: Comments: mild trendelenburg gait pattern with ambulation  POSTURE: rounded shoulders and forward head   LUMBARAROM/PROM:   A/PROM A/PROM  eval   Flexion 25% limited  Extension 25% limited  Right lateral flexion 25% limited  Left lateral flexion 25% limited  Right rotation 25% limited  Left rotation 25% limited   (Blank rows = not tested)  LOWER EXTREMITY ROM: WNL  Active ROM Right eval Left eval  Hip flexion    Hip extension    Hip abduction    Hip adduction    Hip internal rotation    Hip external rotation    Knee flexion    Knee extension    Ankle dorsiflexion    Ankle plantarflexion    Ankle inversion    Ankle eversion     (Blank rows = not tested)  LOWER EXTREMITY MMT: 4-/5 bilateral hips grossly, 4/5 bilateral knees grossly  MMT Right eval Left eval  Hip flexion    Hip extension    Hip abduction    Hip adduction    Hip internal rotation    Hip external rotation    Knee flexion    Knee extension    Ankle dorsiflexion    Ankle plantarflexion    Ankle inversion    Ankle eversion     (Blank rows = not tested) PALPATION:   General: upper chest breathing, abdominal bracing at rest, mild pain with palpation of left sided transverse perineal body   Pelvic Alignment: WNL  Abdominal: no notable abdominal separation, decreased rib mobility                 External Perineal Exam: mild pain with palpation of left sided transverse perineal body                              Internal Pelvic Floor: general overactivity noted throughout bilateral pelvic floor musculature. Left sided tenderness with palpation of levator ani. Lack of coordination between pelvic floor and diaphragm with inhalation.  Patient confirms identification and approves PT to assess internal pelvic floor and treatment Yes No emotional/communication barriers or cognitive limitation. Patient is motivated to learn. Patient understands and agrees with treatment goals and plan. PT explains patient will be examined in standing, sitting, and lying down to see how their muscles  and joints work. When they are ready, they will be asked to remove their  underwear so PT can examine their perineum. The patient is also given the option of providing their own chaperone as one is not provided in our facility. The patient also has the right and is explained the right to defer or refuse any part of the evaluation or treatment including the internal exam. With the patient's consent, PT will use one gloved finger to gently assess the muscles of the pelvic floor, seeing how well it contracts and relaxes and if there is muscle symmetry. After, the patient will get dressed and PT and patient will discuss exam findings and plan of care. PT and patient discuss plan of care, schedule, attendance policy and HEP activities.  PELVIC MMT:   MMT eval  Vaginal 5/5, 10 quick flicks, 10 sec hold  Internal Anal Sphincter   External Anal Sphincter   Puborectalis   Diastasis Recti No significant abdominal separation noted above/at/below umbilicus  (Blank rows = not tested)     TONE: Generally increased muscle tone throughout bilateral pelvic floor musculature   PROLAPSE: None   TODAY'S TREATMENT:                                                                                                                              DATE:   EVAL 07/01/23: Examination completed, findings reviewed, pt educated on POC, HEP, and manual therapy/neuro re-ed/self care. Pt motivated to participate in PT and agreeable to attempt recommendations.  Manual therapy Internal pelvic floor muscle mobilization and palpation to promote relaxation and blood flow  Internal pelvic floor lengthening cues with inhalation for pelvic floor relaxation   Neuro re-ed  Hooklying diaphragmatic breathing + pelvic floor lengthening on inhalation 2x10  Lower trunk rotation + diaphragmatic breathing  2x25min  Childs pose + diaphragmatic breathing  2x75min  Self care  bladder irritants, toileting mechanics for defecation, voiding schedule every 2 hours, relaxation during voiding and defecation   07/15/23:   Neuro re-ed  Hooklying diaphragmatic breathing + pelvic floor lengthening on inhalation and shortening on exhalation 2x10  Lower trunk rotation + diaphragmatic breathing  2x56min  Childs pose + diaphragmatic breathing  2x10min  Happy baby + diaphragmatic breathing 2x61min  Single knee to chest stretch + diaphragmatic breathing 2x50min Sidelying clamshell + reverse clamshell + diaphragmatic breathing 2x10 each  Hooklying march + diaphragmatic breathing for transverse abdominis engagement 2x20 Self care  bladder irritants, toileting mechanics for defecation, voiding schedule every 2 hours, relaxation during voiding and defecation  Fiber and water intake for optimization of bowel movements  PATIENT EDUCATION:  Education details: bladder irritants, toileting mechanics for defecation, voiding schedule every 2 hours  Person educated: Patient Education method: Explanation, Demonstration, Tactile cues, Verbal cues, and Handouts Education comprehension: verbalized understanding, returned demonstration, verbal cues required, and tactile cues required  HOME EXERCISE PROGRAM: Access Code: [email or web address] URL: [email or web address] Date: 07/15/2023 Prepared by: Earna Coder  Exercises -  Supine Pelvic Floor Contraction  - 1 x daily - 7 x weekly - 2 sets - 10 reps - Supine Lower Trunk Rotation  - 1 x daily - 7 x weekly - 2 sets - 20 reps - Child's Pose Stretch  - 1 x daily - 7 x weekly - 2 sets - hold - Supine Pelvic Floor Stretch  - 1 x daily - 7 x weekly - 2 sets - hold - Supine March  - 1 x daily - 7 x weekly - 2 sets - 10 reps - Clamshell  - 1 x daily - 7 x weekly - 2 sets - 10 reps - Sidelying Reverse Clamshell  - 1 x daily - 7 x weekly - 2 sets - 10 reps  ASSESSMENT:  CLINICAL IMPRESSION: Patient is a 23 y.o. female  who was seen today for physical therapy treatment for pelvic pain and urinary dysfunction including pain around bowel/urinary movements and little urgency to  urinate. Patient's pain at rest today is 0/10, but was 7/10 yesterday. Patient has had no urinary leakage since eval. Patient's exercises were progressed to challenge her pelvic floor AROM and new exercises were added to further strengthen the lumbopelvic region. Pt tolerated treatment well with no increase in pain and would benefit from additional PT to further address deficits.   OBJECTIVE IMPAIRMENTS: decreased coordination, decreased endurance, decreased mobility, decreased ROM, decreased strength, and pain.   ACTIVITY LIMITATIONS: continence  PARTICIPATION LIMITATIONS:  ADLs with pain  PERSONAL FACTORS: Past/current experiences are also affecting patient's functional outcome.   REHAB POTENTIAL: Good  CLINICAL DECISION MAKING: Stable/uncomplicated  EVALUATION COMPLEXITY: Low   GOALS: Goals reviewed with patient? Yes  SHORT TERM GOALS: Target date: 07/29/2023  Pt will be independent with HEP.  Baseline: Goal status: INITIAL  2.  Pt will be able to teach back and utilize urge suppression technique in order to help reduce number of trips to the bathroom.   Baseline:  Goal status: INITIAL  3.  Pt will be independent with the knack drill and voiding schedule to improve bladder habits and decrease urinary incontinence.   Baseline:  Goal status: INITIAL  4.  Pt will be independent with use of squatty potty, relaxed toileting mechanics, and improved bowel movement techniques in order to increase ease of bowel movements and complete evacuation.  Baseline:  Goal status: INITIAL  LONG TERM GOALS: Target date: 12/29/2023  Pt will be independent with advanced HEP.  Baseline:  Goal status: INITIAL  2.  Pt will report 75% reduction of pain due to improvements in pelvic floor coordination and control to improve quality of life.  Baseline:  Goal status: INITIAL  3.  Pt will report her BMs are complete due to improved bowel habits and evacuation techniques.  Baseline:  Goal status:  INITIAL  4.  Pt will be able to lift at least 10 lb correctly for 10 reps without pain or leakage for functional activities and to improve quality of life.  Baseline:  Goal status: INITIAL  5.  Pt to demonstrate improved coordination of pelvic floor and breathing mechanics with 10# squat with appropriate synergistic patterns to decrease pain and leakage at least 75% of the time.   Baseline:  Goal status: INITIAL  PLAN:  PT FREQUENCY: 1x/week  PT DURATION: 8 weeks  PLANNED INTERVENTIONS: 97110-Therapeutic exercises, 97530- Therapeutic activity, 97112- Neuromuscular re-education, 97535- Self Care, 16109- Manual therapy, Taping, Dry Needling, Joint mobilization, Spinal mobilization, Scar mobilization, Cryotherapy, and Moist heat  PLAN FOR NEXT SESSION: internal to decrease pain and muscle tension, teach pelvic floor AROM with breathing, more lumbopelvic mobility   Omar Person, PT 07/15/2023, 12:24 PM

## 2023-07-20 ENCOUNTER — Ambulatory Visit: Payer: Medicaid Other | Attending: Physician Assistant | Admitting: Physical Therapy

## 2023-07-20 DIAGNOSIS — R279 Unspecified lack of coordination: Secondary | ICD-10-CM | POA: Diagnosis present

## 2023-07-20 DIAGNOSIS — R293 Abnormal posture: Secondary | ICD-10-CM | POA: Insufficient documentation

## 2023-07-20 DIAGNOSIS — M6281 Muscle weakness (generalized): Secondary | ICD-10-CM | POA: Diagnosis present

## 2023-07-20 NOTE — Patient Instructions (Addendum)
 Strategies for Constipation - Developing a Bowel Routine  Bowels love routine.  Choose the best time of day to have a bowel movement.  Usually the best time of day for a bowel movement in 30 min to 1 hour after breakfast or lunch.  It can take a week or longer to transition to new bowel routines, but if you follow the guidelines below, you will start to retrain your system for more successful bowel routines.  Eat breakfast every morning to try to get things moving along.  Eating stimulates the bowels through turning on the gastrocolic reflex, which are the wave-like contractions of smooth muscle in your intestines to help move feces along.  Adding in a hot beverage with breakfast may help as well.  Think of your digestive tract as being a conveyor belt - new food comes in and pushes along "old food," with the end of the line being a bowel movement.  You may add 1/4 cup or 1/3 cup of prune juice every evening to help stimulate morning bowel function until you see more successful bowel function.  Create routine in both your timing and portions of intake of food and drink throughout the day.  Eat each of your meals at consistent times of the day.  For portions, the size of each meal may vary, but try to eat consistent portions each day at breakfast, lunch and dinner.  For example, you may have a small breakfast every day and a big lunch every day. This is fine if it is a consistent pattern of intake.  With each meal, aim for at least 2 servings of fruits and vegetables and at least one serving of whole grains (whole grain cereal, brown rice, bran, whole wheat or rye bread, oatmeal) to get some fiber in your system.  These foods provide soft bulk for the bowel.  Drink water.  Aim for at least 8 large glasses of water a day.  Water helps the stool stay softer and easier to pass.  If you are adding more fiber in your diet, be sure to increase your water intake too.  Exercise can help move things along the  digestive tract.  Exercise may be a walk, jog, yoga, exercise video, gym time, or even household walking or marching in place at the kitchen countertop.  Pairing exercise and breakfast as part of your morning routine may help establish a morning bowel routine.  Listen to your body's signals that it is time to go to the bathroom!  Our body has automatic reflexes that signal when it is time to have a bowel movement.  If you are establishing routines to help stimulate your bowels, be sure to allow time for toileting.  For example, if you typically have a bowel movement after breakfast, get up early enough to eat AND empty your bowels before your schedule gets too busy or before it becomes inconvenient to access a bathroom.  If we routinely delay or hold back a bowel movement, these reflexes can stop signaling, leading to constipation which may become chronic (long-term).    Ensure proper toileting technique including posture, breathing and emptying strategies.*  Abdominal massage may also help stimulate your bowels and reduce the discomfort that accompanies constipation.*  *Your PT can educate you on toileting strategies and self-massage for your abdomen.  Your PT may also ask you to complete a food log and bowel diary for 1-3 days to help identify patterns and make suggestions for increased success in managing your  symptoms.   How to breathe when passing bowel movements: Use a squatty potty stool to assist with the angle of evacuation  Blow as you go - imagine that you are fogging up a mirror with your breath as you exhale and push at the same time. Avoid straining/holding your breath if you can.

## 2023-07-20 NOTE — Therapy (Signed)
 OUTPATIENT PHYSICAL THERAPY FEMALE PELVIC TREATMENT   Patient Name: NOVIS LEAGUE MRN: [id number] DOB:01-01-2001, 23 y.o., female Today's Date: 07/20/2023  END OF SESSION:  PT End of Session - 07/20/23 1000     Visit Number 3    Number of Visits 8    Date for PT Re-Evaluation 08/26/23    Authorization Type [place] Medicaid    PT Start Time 0926    PT Stop Time 1010    PT Time Calculation (min) 44 min    Activity Tolerance Patient tolerated treatment well    Behavior During Therapy [facility] for tasks assessed/performed               Past Medical History:  Diagnosis Date   Cystic fibrosis carrier, 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   PCP: has one, cannot remember at this time   REFERRING PROVIDER: Reubin Milan, PA-C   REFERRING DIAG: R30.0 (ICD-10-CM) - Dysuria M62.89 (ICD-10-CM) - Pelvic floor dysfunction  THERAPY DIAG:  Muscle weakness (generalized)  Unspecified lack of coordination  Abnormal posture  Rationale for Evaluation and Treatment: Rehabilitation  ONSET DATE: 04/2023  SUBJECTIVE:                                                                                                                                                                                           SUBJECTIVE STATEMENT: Patient reports that she has gotten back into working out yesterday - doing core work and upper body. She is feeling sore today from this. She noticed that her exercises from PT help in the moment with muscle burning/pain, but she can still feel soreness in the abdomen after the exercises are complete. 5/10 discomfort today - she is wearing her waist trainer to help provide abdominal support. No leakage to report, but when she has to go, she has to go then. Patient has been eating more fiber/drinking more water -  she had a rough bowel movement yesterday and she had to push a lot.   Fluid intake: drinks ~7 bottles water daily, adds juice to water daily, 3x/day she will add lemon to water   PAIN:  Are you having pain? Yesin the back and pelvis NPRS scale: 5/10 Pain location: Internal, External, Deep, and Bilateral  Pain type: aching and dull Pain description: intermittent and aching   Aggravating factors: urination, standing for a long period of time, cleaning the house   Relieving factors: warm baths, using the bathroom  PRECAUTIONS: None  RED FLAGS: None   WEIGHT BEARING RESTRICTIONS: No  FALLS:  Has patient fallen in last 6 months? No  OCCUPATION: stay at home mom   ACTIVITY LEVEL : started going to a yoga class   PLOF: Independent  PATIENT GOALS: decrease pain that she feels, normalized bladder habits   PERTINENT HISTORY:  Sexual abuse: No  BOWEL MOVEMENT: Pain with bowel movement: Yes- she feels this pain when she feels her rectum full  Type of bowel movement:Type (Bristol Stool Scale) 2-3, Frequency 0-1, Strain Yes, and Splinting no Fully empty rectum: No Leakage: No Pads: No Fiber supplement/laative No  URINATION: Pain with urination: No Fully empty bladder: Yes: sometimes Stream: Strong Urgency: Yes: sometimes will cause leakage Frequency: 4x/day, goes 3-4x/night  Leakage: Urge to void, Sneezing, Laughing, and Lifting Pads: Yes: liners for leakage- typically wears 3 per day   INTERCOURSE:  Ability to have vaginal penetration No.personal preference due to pain   PREGNANCY: Vaginal deliveries 2 Tearing Yes: 2nd degree tears with both   PROLAPSE: None  OBJECTIVE:  Note: Objective measures were completed at Evaluation unless otherwise noted.  PATIENT SURVEYS: PFIQ-7: 22  COGNITION: Overall cognitive status: Within functional limits for tasks assessed   SENSATION: Light touch: Appears intact  LUMBAR SPECIAL TESTS:  Single leg stance test:  Positive  FUNCTIONAL TESTS:  Squat: dynamic bilateral knee valgus, low back pain   GAIT: Comments: mild trendelenburg gait pattern with ambulation  POSTURE: rounded shoulders and forward head   LUMBARAROM/PROM:   A/PROM A/PROM  eval  Flexion 25% limited  Extension 25% limited  Right lateral flexion 25% limited  Left lateral flexion 25% limited  Right rotation 25% limited  Left rotation 25% limited   (Blank rows = not tested)  LOWER EXTREMITY ROM: WNL  Active ROM Right eval Left eval  Hip flexion    Hip extension    Hip abduction    Hip adduction    Hip internal rotation    Hip external rotation    Knee flexion    Knee extension    Ankle dorsiflexion    Ankle plantarflexion    Ankle inversion    Ankle eversion     (Blank rows = not tested)  LOWER EXTREMITY MMT: 4-/5 bilateral hips grossly, 4/5 bilateral knees grossly  MMT Right eval Left eval  Hip flexion    Hip extension    Hip abduction    Hip adduction    Hip internal rotation    Hip external rotation    Knee flexion    Knee extension    Ankle dorsiflexion    Ankle plantarflexion    Ankle inversion    Ankle eversion     (Blank rows = not tested) PALPATION:   General: upper chest breathing, abdominal bracing at rest, mild pain with palpation of left sided transverse perineal body   Pelvic Alignment: WNL  Abdominal: no notable abdominal separation, decreased rib mobility                 External Perineal Exam: mild pain with palpation of left sided transverse perineal body                              Internal Pelvic Floor: general overactivity noted throughout bilateral pelvic floor musculature. Left sided tenderness with palpation of levator ani. Lack of coordination between pelvic floor and diaphragm with inhalation.  Patient confirms identification and approves PT  to assess internal pelvic floor and treatment Yes No emotional/communication barriers or cognitive limitation. Patient is  motivated to learn. Patient understands and agrees with treatment goals and plan. PT explains patient will be examined in standing, sitting, and lying down to see how their muscles and joints work. When they are ready, they will be asked to remove their underwear so PT can examine their perineum. The patient is also given the option of providing their own chaperone as one is not provided in our facility. The patient also has the right and is explained the right to defer or refuse any part of the evaluation or treatment including the internal exam. With the patient's consent, PT will use one gloved finger to gently assess the muscles of the pelvic floor, seeing how well it contracts and relaxes and if there is muscle symmetry. After, the patient will get dressed and PT and patient will discuss exam findings and plan of care. PT and patient discuss plan of care, schedule, attendance policy and HEP activities.  PELVIC MMT:   MMT eval  Vaginal 5/5, 10 quick flicks, 10 sec hold  Internal Anal Sphincter   External Anal Sphincter   Puborectalis   Diastasis Recti No significant abdominal separation noted above/at/below umbilicus  (Blank rows = not tested)     TONE: Generally increased muscle tone throughout bilateral pelvic floor musculature   PROLAPSE: None   TODAY'S TREATMENT:                                                                                                                              DATE:   EVAL 07/01/23: Examination completed, findings reviewed, pt educated on POC, HEP, and manual therapy/neuro re-ed/self care. Pt motivated to participate in PT and agreeable to attempt recommendations.  Manual therapy Internal pelvic floor muscle mobilization and palpation to promote relaxation and blood flow  Internal pelvic floor lengthening cues with inhalation for pelvic floor relaxation   Neuro re-ed  Hooklying diaphragmatic breathing + pelvic floor lengthening on inhalation 2x10  Lower  trunk rotation + diaphragmatic breathing  2x41min  Childs pose + diaphragmatic breathing  2x68min  Self care  bladder irritants, toileting mechanics for defecation, voiding schedule every 2 hours, relaxation during voiding and defecation   07/15/23:  Neuro re-ed  Hooklying diaphragmatic breathing + pelvic floor lengthening on inhalation and shortening on exhalation 2x10  Lower trunk rotation + diaphragmatic breathing  2x59min  Childs pose + diaphragmatic breathing  2x74min  Happy baby + diaphragmatic breathing 2x57min  Single knee to chest stretch + diaphragmatic breathing 2x70min Sidelying clamshell + reverse clamshell + diaphragmatic breathing 2x10 each  Hooklying march + diaphragmatic breathing for transverse abdominis engagement 2x20 Self care  bladder irritants, toileting mechanics for defecation, voiding schedule every 2 hours, relaxation during voiding and defecation  Fiber and water intake for optimization of bowel movements  07/20/23:  Neuro re-ed  Seated diaphragmatic breathing + pelvic floor lengthening on inhalation and  shortening on exhalation 2x10  Sidelying diaphragmatic breathing + ball press for transverse abdominis contraction 2x10 each side  Bridge + single leg kick out + diaphragmatic breathing 2x10 alternating legs  Qped bird dog + diaphragmatic breathing 2x10 alternating  Lower trunk rotations 2x21min  Open books + diaphragmatic breathing 2x10 each side  Cat/cow + diaphragmatic breathing Self care  bladder irritants, toileting mechanics for defecation, voiding schedule every 2 hours, relaxation during voiding and defecation  Fiber and water intake for optimization of bowel movements Constipation management and bowel routines   PATIENT EDUCATION:  Education details: bladder irritants, toileting mechanics for defecation, voiding schedule every 2 hours  Person educated: Patient Education method: Explanation, Demonstration, Tactile cues, Verbal cues, and  Handouts Education comprehension: verbalized understanding, returned demonstration, verbal cues required, and tactile cues required  HOME EXERCISE PROGRAM: Access Code: [email or web address] URL: [email or web address] Date: 07/20/2023 Prepared by: Earna Coder  Exercises - Seated Pelvic Floor Contraction  - 1 x daily - 7 x weekly - 2 sets - 10 reps - Sidelying Transversus Abdominis Bracing  - 1 x daily - 7 x weekly - 2 sets - 10 reps - Bird Dog  - 1 x daily - 7 x weekly - 2 sets - 20 reps - Supine Bridge with Leg Extension  - 1 x daily - 7 x weekly - 2 sets - 10 reps - Supine Pelvic Floor Stretch  - 1 x daily - 7 x weekly - 2 sets - hold - Sidelying Thoracic Rotation with Open Book  - 1 x daily - 7 x weekly - 2 sets - 10 reps  ASSESSMENT:  CLINICAL IMPRESSION: Patient is a 23 y.o. female  who was seen today for physical therapy treatment for pelvic pain and urinary dysfunction including pain around bowel/urinary movements and little urgency to urinate. Patient's pain at rest today is 0/10, but was 5/10 yesterday. Patient has had no urinary leakage since last visit. Patient's exercises were progressed to challenge her pelvic floor AROM in seated and new exercises were added to further strengthen the lumbopelvic region. Moderate cueing required for diaphragmatic breathing cues today. Pt tolerated treatment well with no increase in pain and would benefit from additional PT to further address deficits.   OBJECTIVE IMPAIRMENTS: decreased coordination, decreased endurance, decreased mobility, decreased ROM, decreased strength, and pain.   ACTIVITY LIMITATIONS: continence  PARTICIPATION LIMITATIONS:  ADLs with pain  PERSONAL FACTORS: Past/current experiences are also affecting patient's functional outcome.   REHAB POTENTIAL: Good  CLINICAL DECISION MAKING: Stable/uncomplicated  EVALUATION COMPLEXITY: Low   GOALS: Goals reviewed with patient? Yes  SHORT TERM GOALS: Target date:  07/29/2023  Pt will be independent with HEP.  Baseline: Goal status: INITIAL  2.  Pt will be able to teach back and utilize urge suppression technique in order to help reduce number of trips to the bathroom.   Baseline:  Goal status: INITIAL  3.  Pt will be independent with the knack drill and voiding schedule to improve bladder habits and decrease urinary incontinence.   Baseline:  Goal status: INITIAL  4.  Pt will be independent with use of squatty potty, relaxed toileting mechanics, and improved bowel movement techniques in order to increase ease of bowel movements and complete evacuation.  Baseline:  Goal status: INITIAL  LONG TERM GOALS: Target date: 12/29/2023  Pt will be independent with advanced HEP.  Baseline:  Goal status: INITIAL  2.  Pt will report 75% reduction of pain due to improvements in pelvic  floor coordination and control to improve quality of life.  Baseline:  Goal status: INITIAL  3.  Pt will report her BMs are complete due to improved bowel habits and evacuation techniques.  Baseline:  Goal status: INITIAL  4.  Pt will be able to lift at least 10 lb correctly for 10 reps without pain or leakage for functional activities and to improve quality of life.  Baseline:  Goal status: INITIAL  5.  Pt to demonstrate improved coordination of pelvic floor and breathing mechanics with 10# squat with appropriate synergistic patterns to decrease pain and leakage at least 75% of the time.   Baseline:  Goal status: INITIAL  PLAN:  PT FREQUENCY: 1x/week  PT DURATION: 8 weeks  PLANNED INTERVENTIONS: 97110-Therapeutic exercises, 97530- Therapeutic activity, 97112- Neuromuscular re-education, 97535- Self Care, 82956- Manual therapy, Taping, Dry Needling, Joint mobilization, Spinal mobilization, Scar mobilization, Cryotherapy, and Moist heat  PLAN FOR NEXT SESSION: internal to decrease pain and muscle tension, teach pelvic floor AROM with breathing, more lumbopelvic  mobility   Omar Person, PT 07/20/2023, 10:10 AM

## 2023-07-23 ENCOUNTER — Ambulatory Visit: Payer: Medicaid Other | Admitting: Cardiology

## 2023-07-29 ENCOUNTER — Ambulatory Visit: Payer: Medicaid Other | Admitting: Physical Therapy

## 2023-08-05 ENCOUNTER — Telehealth: Payer: Self-pay | Admitting: Physical Therapy

## 2023-08-05 ENCOUNTER — Ambulatory Visit: Payer: Medicaid Other | Admitting: Physical Therapy

## 2023-08-05 NOTE — Telephone Encounter (Signed)
 PT called patient and left message regarding next appointment and informed that per attendance policy, all remaining visits after next scheduled are to be cancelled and we will be booking appointments on an individual basis.   Earna Coder, PT, DPT 08/05/23 1:15 PM

## 2023-08-12 ENCOUNTER — Ambulatory Visit: Payer: Medicaid Other | Admitting: Physical Therapy

## 2023-08-12 ENCOUNTER — Telehealth: Payer: Self-pay | Admitting: Physical Therapy

## 2023-08-12 NOTE — Telephone Encounter (Signed)
 Patient informed of no-showing 3 appointments and has no remaining appointments scheduled. She was encouraged to call and schedule if she would like to be seen again.   Earna Coder, PT, DPT 08/12/23 9:59 AM

## 2023-08-19 ENCOUNTER — Encounter: Payer: Medicaid Other | Admitting: Physical Therapy

## 2023-08-26 ENCOUNTER — Encounter: Payer: Medicaid Other | Admitting: Physical Therapy

## 2023-09-17 ENCOUNTER — Ambulatory Visit: Admitting: Obstetrics & Gynecology

## 2023-09-17 ENCOUNTER — Encounter: Payer: Self-pay | Admitting: Obstetrics & Gynecology

## 2023-09-17 VITALS — BP 113/77 | HR 72 | Ht 65.0 in | Wt 145.0 lb

## 2023-09-17 DIAGNOSIS — R590 Localized enlarged lymph nodes: Secondary | ICD-10-CM | POA: Diagnosis not present

## 2023-09-17 NOTE — Progress Notes (Unsigned)
 Patient ID: Jodi Mckenzie, female   DOB: 15-Apr-2001, 23 y.o.   MRN: [id number]  Chief Complaint  Patient presents with   LUMP UNDER ARM    HPI Jodi Mckenzie is a 23 y.o. female.  [id number] She is 5 mo PP and is nursing. She notes lumps in her axillae that wax and wane and are sometimes tender. No breast tenderness or redness or fever. 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   GERD (gastroesophageal reflux disease)     Past Surgical History:  Procedure Laterality Date   NO PAST SURGERIES      Family History  Problem Relation Age of Onset   Thyroid  disease Mother    Diabetes Father     Social History Social History   Tobacco Use   Smoking status: Never   Smokeless tobacco: Never  Vaping Use   Vaping status: Never Used  Substance Use Topics   Alcohol use: Never   Drug use: Never    Allergies  Allergen Reactions   Latex Itching and Hives    Current Outpatient Medications  Medication Sig Dispense Refill   prenatal vitamin w/FE, FA (PRENATAL 1 + 1) 27-1 MG TABS tablet Take 1 tablet by mouth daily at 12 noon.     No current facility-administered medications for this visit.    Review of Systems Review of Systems  Constitutional: Negative.   Respiratory: Negative.    Cardiovascular: Negative.   Gastrointestinal: Negative.   Musculoskeletal:        Possible enlarged lymph nodes both axillae    Blood pressure 113/77, pulse 72, height 5\' 5"  (1.651 m), weight 145 lb (65.8 kg), currently breastfeeding.  Physical Exam Physical Exam Vitals and nursing note reviewed. Exam conducted with a chaperone present.  Constitutional:      Appearance: Normal appearance.  Cardiovascular:     Rate and Rhythm: Normal rate.  Pulmonary:     Effort: Pulmonary effort is normal.  Lymphadenopathy:     Upper Body:     Right upper body: Axillary adenopathy present.     Left upper body: Axillary  adenopathy present.  Neurological:     Mental Status: She is alert.  Psychiatric:        Mood and Affect: Mood normal.        Behavior: Behavior normal.   Single mobile soft non-tender LN in each axilla smooth <2 cm  Data Reviewed   Assessment Reactive lymphadenopathy in axillae while nursing  Plan She plans to nurse for one year, if sx worsen or do note resolve after weaning report to MD    Onnie Bilis 09/17/2023, 9:37 AM

## 2023-09-17 NOTE — Progress Notes (Signed)
 23 y.o. GYN presents for hard, painful 5/10 lump under both armpits.  Last PAP 05/27/23

## 2023-11-22 ENCOUNTER — Other Ambulatory Visit: Payer: Self-pay

## 2023-11-22 ENCOUNTER — Ambulatory Visit
Admission: EM | Admit: 2023-11-22 | Discharge: 2023-11-22 | Disposition: A | Discharge status: Home / Self Care | Source: Ambulatory Visit | Attending: Physician Assistant | Admitting: Physician Assistant

## 2023-11-22 DIAGNOSIS — R309 Painful micturition, unspecified: Secondary | ICD-10-CM | POA: Diagnosis not present

## 2023-11-22 DIAGNOSIS — R102 Pelvic and perineal pain: Secondary | ICD-10-CM | POA: Insufficient documentation

## 2023-11-22 DIAGNOSIS — N898 Other specified noninflammatory disorders of vagina: Secondary | ICD-10-CM | POA: Diagnosis not present

## 2023-11-22 DIAGNOSIS — R300 Dysuria: Secondary | ICD-10-CM | POA: Diagnosis not present

## 2023-11-22 DIAGNOSIS — M545 Low back pain, unspecified: Secondary | ICD-10-CM | POA: Diagnosis present

## 2023-11-22 LAB — POCT URINALYSIS DIP (MANUAL ENTRY)
Bilirubin, UA: NEGATIVE
Blood, UA: NEGATIVE
Glucose, UA: NEGATIVE mg/dL
Ketones, POC UA: NEGATIVE mg/dL
Leukocytes, UA: NEGATIVE
Nitrite, UA: NEGATIVE
Protein Ur, POC: NEGATIVE mg/dL
Spec Grav, UA: <=1.005 — AB (ref 1.010–1.025)
Urobilinogen, UA: 0.2 U/dL
pH, UA: 5.5 (ref 5.0–8.0)

## 2023-11-22 LAB — POCT URINE PREGNANCY: Preg Test, Ur: NEGATIVE

## 2023-11-22 NOTE — ED Triage Notes (Addendum)
 Pt presents with complaints of lower back pain and burning with urination x 1 week. States her overall pain is a 6/10. Describes back pain as aching. Pain increases with urination. Denies taking OTC medications PTA.

## 2023-11-22 NOTE — ED Provider Notes (Signed)
 [facility]    CSN: [id number] Arrival date & time: 11/22/23  1842      History   Chief Complaint Chief Complaint  Patient presents with   Back Pain   Burning with Urination     HPI Jodi Mckenzie is a 23 y.o. female.   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   Past Surgical History:  Procedure Laterality Date   NO PAST SURGERIES      OB History     Gravida  2   Para  2   Term  2   Preterm      AB      Living  2      SAB      IAB      Ectopic      Multiple  0   Live Births  2            Home Medications    Prior to Admission medications   Medication Sig Start Date End Date Taking? Authorizing Provider  prenatal vitamin w/FE, FA (PRENATAL 1 + 1) 27-1 MG TABS tablet Take 1 tablet by mouth daily at 12 noon.    [provider]    Family History Family History  Problem Relation Age of Onset   Thyroid  disease Mother    Diabetes Father     Social History Social History   Tobacco Use   Smoking status: Never   Smokeless tobacco: Never  Vaping Use   Vaping status: Never Used  Substance Use Topics   Alcohol use: Never   Drug use: Never     Allergies   Latex   Review of Systems Review of Systems  Gastrointestinal:  Positive for abdominal pain (suprapubic in nature).  Genitourinary:  Positive for dysuria and vaginal discharge. Negative  for difficulty urinating, frequency, genital sores, pelvic pain, vaginal bleeding and vaginal pain.  Musculoskeletal:  Positive for back pain.  Skin:  Negative for rash.     Physical Exam Triage Vital Signs ED Triage Vitals  Encounter Vitals Group     BP 11/22/23 1941 121/78     Girls Systolic BP Percentile --      Girls Diastolic BP Percentile --      Boys Systolic BP Percentile --      Boys Diastolic BP Percentile --      Pulse Rate 11/22/23 1941 75     Resp 11/22/23 1941 16     Temp 11/22/23 1941 97.9 F (36.6 C)     Temp Source 11/22/23 1941 Oral     SpO2 11/22/23 1941 98 %     Weight 11/22/23 1941 145 lb (65.8 kg)     Height 11/22/23 1941 5' 5 (1.651 m)     Head Circumference --      Peak Flow --      Pain Score 11/22/23 1951 6  Pain Loc --      Pain Education --      Exclude from Growth Chart --    No data found.  Updated Vital Signs BP 121/78 (BP Location: Right Arm)   Pulse 75   Temp 97.9 F (36.6 C) (Oral)   Resp 16   Ht 5' 5 (1.651 m)   Wt 145 lb (65.8 kg)   SpO2 98%   Breastfeeding Yes   BMI 24.13 kg/m   Visual Acuity Right Eye Distance:   Left Eye Distance:   Bilateral Distance:    Right Eye Near:   Left Eye Near:    Bilateral Near:     Physical Exam Vitals reviewed.  Constitutional:      General: She is awake.     Appearance: Normal appearance. She is well-developed and well-groomed.  HENT:     Head: Normocephalic and atraumatic.  Eyes:     General: Lids are normal. Gaze aligned appropriately.     Extraocular Movements: Extraocular movements intact.     Conjunctiva/sclera: Conjunctivae normal.  Pulmonary:     Effort: Pulmonary effort is normal.  Neurological:     Mental Status: She is alert and oriented to person, place, and time.  Psychiatric:        Attention and Perception: Attention and perception normal.        Mood and Affect: Mood and affect normal.        Speech: Speech normal.        Behavior: Behavior normal.  Behavior is cooperative.      UC Treatments / Results  Labs (all labs ordered are listed, but only abnormal results are displayed) Labs Reviewed  POCT URINALYSIS DIP (MANUAL ENTRY) - Abnormal; Notable for the following components:      Result Value   Color, UA light yellow (*)    Spec Grav, UA <=1.005 (*)    All other components within normal limits  POCT URINE PREGNANCY  CERVICOVAGINAL ANCILLARY ONLY    EKG   Radiology No results found.  Procedures Procedures (including critical care time)  Medications Ordered in UC Medications - No data to display  Initial Impression / Assessment and Plan / UC Course  I have reviewed the triage vital signs and the nursing notes.  Pertinent labs & imaging results that were available during my care of the patient were reviewed by me and considered in my medical decision making (see chart for details).      Final Clinical Impressions(s) / UC Diagnoses   Final diagnoses:  Vaginal discharge  Dysuria  Suprapubic pain   Pt presents today with concerns for dysuria, vaginal discharge changes and suprapubic pain that has been ongoing for almost a week. Urine dip was negative for signs indicative of UTI or hematuria. Urine pregnancy negative. Will collect cervicovaginal swab to rule out BV, trich, yeast, gonorrhea, chlamydia. Results to dictate further management. Follow up as needed or indicated by testing results.     Discharge Instructions      You were seen today for concerns of vaginal discharge changes and pain with urination Your urine was normal and did not show evidence of UTI or blood in the urine.  We have collected a cervicovaginal swab which will assess for bacterial vaginosis, yeast, trichomonas, gonorrhea, chlamydia. We will keep you updated on the results of your cervicovaginal swab once the results are available.  If medication or treatment is indicated by those results that will be sent into the pharmacy that  we have on  file. Please make sure that you are practicing safe sex and using barrier methods to prevent exposure. It is recommended to avoid intercourse until you have the results back from testing and have completed any treatments that are sent in for you.       ED Prescriptions   None    PDMP not reviewed this encounter.   Marylene Rocky BRAVO, PA-C 11/23/23 1132

## 2023-11-22 NOTE — Discharge Instructions (Addendum)
 You were seen today for concerns of vaginal discharge changes and pain with urination Your urine was normal and did not show evidence of UTI or blood in the urine.  We have collected a cervicovaginal swab which will assess for bacterial vaginosis, yeast, trichomonas, gonorrhea, chlamydia. We will keep you updated on the results of your cervicovaginal swab once the results are available.  If medication or treatment is indicated by those results that will be sent into the pharmacy that we have on file. Please make sure that you are practicing safe sex and using barrier methods to prevent exposure. It is recommended to avoid intercourse until you have the results back from testing and have completed any treatments that are sent in for you.

## 2023-11-24 LAB — CERVICOVAGINAL ANCILLARY ONLY
Bacterial Vaginitis (gardnerella): NEGATIVE
Candida Glabrata: NEGATIVE
Candida Vaginitis: NEGATIVE
Chlamydia: NEGATIVE
Comment: NEGATIVE
Comment: NEGATIVE
Comment: NEGATIVE
Comment: NEGATIVE
Comment: NEGATIVE
Comment: NORMAL
Neisseria Gonorrhea: NEGATIVE
Trichomonas: NEGATIVE

## 2024-01-20 ENCOUNTER — Encounter: Payer: Self-pay | Admitting: Cardiology

## 2024-01-20 ENCOUNTER — Ambulatory Visit

## 2024-01-20 ENCOUNTER — Ambulatory Visit: Attending: Cardiology | Admitting: Cardiology

## 2024-01-20 VITALS — BP 118/80 | HR 96 | Ht 65.0 in | Wt 153.8 lb

## 2024-01-20 DIAGNOSIS — R002 Palpitations: Secondary | ICD-10-CM | POA: Insufficient documentation

## 2024-01-20 DIAGNOSIS — R5383 Other fatigue: Secondary | ICD-10-CM | POA: Diagnosis present

## 2024-01-20 DIAGNOSIS — R000 Tachycardia, unspecified: Secondary | ICD-10-CM | POA: Insufficient documentation

## 2024-01-20 DIAGNOSIS — R0600 Dyspnea, unspecified: Secondary | ICD-10-CM | POA: Diagnosis present

## 2024-01-20 DIAGNOSIS — Z7689 Persons encountering health services in other specified circumstances: Secondary | ICD-10-CM | POA: Insufficient documentation

## 2024-01-20 NOTE — Progress Notes (Signed)
 [facility]  New Evaluation  Date:  01/22/2024   ID:  Jodi Mckenzie, DOB 2000/08/16, MRN [id number]  PCP:  Patient, No Pcp Per    HeartCare Providers Cardiologist:  Dub Huntsman, DO  Electrophysiologist:  None       Referring MD: Milly Olam LABOR,*   Chief Complaint: I have been experiencing palpitations  History of Present Illness:    Jodi Mckenzie is a 23 y.o. female [G2P2002] who is being seen today for the evaluation of palpitations at the request of Leftwich-Kirby, Olam LABOR,*.   Medical hx : Note Cystic fibrosis carrier  She reported that she has been experiencing intermittent palpitations and is worsened in the postpartum period.  She did have some of these when she was pregnant.  But now they are intermittent but when they come they last for a few minutes and then resolved.  Associated shortness of breath.  The sensation makes her feel nervous.    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     Allergies:   Latex   Social History   Socioeconomic History   Marital status: Significant Other    Spouse name: Eric   Number of children: Not on file   Years of education: Not on file   Highest education level: Not on file  Occupational History   Not on file  Tobacco Use   Smoking status: Never   Smokeless tobacco: Never   Vaping Use   Vaping status: Never Used  Substance and Sexual Activity   Alcohol use: Never   Drug use: Never   Sexual activity: Not Currently    Birth control/protection: None  Other Topics Concern   Not on file  Social History Narrative   Not on file   Social Drivers of Health   Financial Resource Strain: Low Risk  (09/28/2023)   Received from [facility]   Overall Financial Resource Strain (CARDIA)    Difficulty of Paying Living Expenses: Not hard at all  Food Insecurity: No Food Insecurity (09/28/2023)   Received from [facility]   Hunger Vital Sign    Within the past 12 months, you worried that your food would run out before you got the money to buy more.: Never true    Within the past 12 months, the food you bought just didn't last and you didn't have money to get more.: Never true  Transportation Needs: No Transportation Needs (09/28/2023)   Received from [facility] - Transportation    Lack of Transportation (Medical): No  Lack of Transportation (Non-Medical): No  Physical Activity: Sufficiently Active (09/28/2023)   Received from [facility]   Exercise Vital Sign    On average, how many days per week do you engage in moderate to strenuous exercise (like a brisk walk)?: 7 days    On average, how many minutes do you engage in exercise at this level?: 60 min  Recent Concern: Physical Activity - Insufficiently Active (07/07/2023)   Received from [facility]   Exercise Vital Sign    Days of Exercise per Week: 2 days    Minutes of Exercise per Session: 20 min  Stress: No Stress Concern Present (09/28/2023)   Received from [facility] - Occupational Stress Questionnaire    Feeling of Stress : Not at all  Social Connections: Socially Integrated (09/28/2023)   Received from [facility]   Social Network    How would you rate your social network (family, work, friends)?: Good participation with social networks       Family History  Problem Relation Age of Onset   Thyroid  disease Mother    Diabetes Father       ROS:   Please see the history of present illness.     All other systems reviewed and are negative.   Labs/EKG Reviewed:    EKG:   EKG was ordered today.  The ekg ordered today demonstrates sinus rhythm   Recent Labs: 06/04/2023: ALT 21; BUN 9; Creatinine, Ser 0.57; Hemoglobin 13.9; Platelets 264; Potassium 4.0; Sodium 140   Recent Lipid Panel No results found for: CHOL, TRIG, HDL, CHOLHDL, LDLCALC, LDLDIRECT  Physical Exam:    VS:  BP 118/80   Pulse 96   Ht 5' 5 (1.651 m)   Wt 153 lb 12.8 oz (69.8 kg)   SpO2 97%   BMI 25.59 kg/m     Wt Readings from Last 3 Encounters:  01/20/24 153 lb 12.8 oz (69.8 kg)  11/22/23 145 lb (65.8 kg)  09/17/23 145 lb (65.8 kg)     GEN:  Well nourished, well developed in no acute distress HEENT: Normal NECK: No JVD; No carotid bruits LYMPHATICS: No lymphadenopathy CARDIAC: RRR, no murmurs, rubs, gallops RESPIRATORY:  Clear to auscultation without rales, wheezing or rhonchi  ABDOMEN: Soft, non-tender, non-distended MUSCULOSKELETAL:  No edema; No deformity  SKIN: Warm and dry NEUROLOGIC:  Alert and oriented x 3 PSYCHIATRIC:  Normal affect    Risk Assessment/Risk Calculators:     CARPREG II Risk Prediction Index Score:  1.  The patient's risk for a primary cardiac event is 5%.            ASSESSMENT & PLAN:    I would like to rule out a cardiovascular etiology of this palpitation, therefore at this time I would like to placed a zio patch for   14  days. In additon with her shortness of breath a transthoracic echocardiogram will be ordered to assess LV/RV function and any structural abnormalities. Once these testing have been performed amd reviewed further reccomendations will be made. For now, I do reccomend that the patient goes to the nearest ED if  symptoms recur.    Patient Instructions  Medication  Instructions:  Your physician recommends that you continue on your current medications as directed. Please refer to the Current Medication list given to you today.  *If you need a refill on your cardiac medications before your next appointment, please call your pharmacy*  Lab Work: Vit D  If you have labs (blood work) drawn today and your tests are completely normal, you will receive your results only by: MyChart Message (if you have MyChart) OR A paper copy in the mail If you have any lab test that is abnormal or we need to change your treatment, we will call you to review the results.  Testing/Procedures: Your physician has requested that you have an echocardiogram. Echocardiography is a painless test that uses sound waves to create images of your heart. It provides your doctor with information about the size and shape of your heart and how well your heart's chambers and valves are working. This procedure takes approximately one hour. There are no restrictions for this procedure. Please do NOT wear cologne, perfume, aftershave, or lotions (deodorant is allowed). Please arrive 15 minutes prior to your appointment time.  Please note: We ask at that you not bring children with you during ultrasound (echo/ vascular) testing. Due to room size and safety concerns, children are not allowed in the ultrasound rooms during exams. Our front office staff cannot provide observation of children in our lobby area while testing is being conducted. An adult accompanying a patient to their appointment will only be allowed in the ultrasound room at the discretion of the ultrasound technician under special circumstances. We apologize for any inconvenience.  ZIO XT- Long Term Monitor Instructions  Your physician has requested you wear a ZIO patch monitor for 14 days.  This is a single patch monitor. [employer] supplies one patch monitor per enrollment. Additional stickers are not available. Please do not apply patch  if you will be having a Nuclear Stress Test,  Echocardiogram, Cardiac CT, MRI, or Chest Xray during the period you would be wearing the  monitor. The patch cannot be worn during these tests. You cannot remove and re-apply the  ZIO XT patch monitor.  Your ZIO patch monitor will be mailed 3 day USPS to your address on file. It may take 3-5 days  to receive your monitor after you have been enrolled.  Once you have received your monitor, please review the enclosed instructions. Your monitor  has already been registered assigning a specific monitor serial # to you.  Billing and Patient Assistance Program Information  We have supplied [employer] with any of your insurance information on file for billing purposes. [employer] offers a sliding scale Patient Assistance Program for patients that do not have  insurance, or whose insurance does not completely cover the cost of the ZIO monitor.  You must apply for the Patient Assistance Program to qualify for this discounted rate.  To apply, please call [employer] at [phone], select option 4, select option 2, ask to apply for  Patient Assistance Program. Meredeth will ask your household income, and how many people  are in your household. They will quote your out-of-pocket cost based on that information.  [employer] will also be able to set up a 80-month, interest-free payment plan if needed.  Applying the monitor   Shave hair from upper left chest.  Hold abrader disc by orange tab. Rub abrader in 40 strokes over the upper left chest as  indicated in your monitor instructions.  Clean area with 4 enclosed alcohol pads. Let dry.  Apply patch as indicated in monitor instructions. Patch will be placed under collarbone on left  side of chest with arrow pointing upward.  Rub patch adhesive wings for 2 minutes. Remove white label marked 1. Remove the white  label marked 2. Rub patch adhesive wings  for 2 additional minutes.  While looking in a mirror, press and  release button in center of patch. A small green light will  flash 3-4 times. This will be your only indicator that the monitor has been turned on.  Do not shower for the first 24 hours. You may shower after the first 24 hours.  Press the button if you feel a symptom. You will hear a small click. Record Date, Time and  Symptom in the Patient Logbook.  When you are ready to remove the patch, follow instructions on the last 2 pages of Patient  Logbook. Stick patch monitor onto the last page of Patient Logbook.  Place Patient Logbook in the blue and white box. Use locking tab on box and tape box closed  securely. The blue and white box has prepaid postage on it. Please place it in the mailbox as  soon as possible. Your physician should have your test results approximately 7 days after the  monitor has been mailed back to [facility].  Call [facility] Customer Care at [phone] if you have questions regarding  your ZIO XT patch monitor. Call them immediately if you see an orange light blinking on your  monitor.  If your monitor falls off in less than 4 days, contact our Monitor department at [phone].  If your monitor becomes loose or falls off after 4 days call [employer] at [phone] for  suggestions on securing your monitor   Follow-Up: At [facility], you and your health needs are our priority.  As part of our continuing mission to provide you with exceptional heart care, our providers are all part of one team.  This team includes your primary Cardiologist (physician) and Advanced Practice Providers or APPs (Physician Assistants and Nurse Practitioners) who all work together to provide you with the care you need, when you need it.  Your next appointment:   16 week(s) via MyChart  Provider:   Kaylinn Dedic, DO     Other Instructions   Here are some foods and drinks that can help you replenish your electrolyte stores. Drink unsweetened coconut water. Coconut  water is a good source of electrolytes. ... Eat bananas. ... Consume dairy products. ... Cook white meat and poultry. ... Eat avocado. ... Drink fruit juice. ... Snack on watermelon. ... Try electrolyte infused waters.        Dispo:  No follow-ups on file.   Medication Adjustments/Labs and Tests Ordered: Current medicines are reviewed at length with the patient today.  Concerns regarding medicines are outlined above.  Tests Ordered: Orders Placed This Encounter  Procedures   VITAMIN D 25 Hydroxy (Vit-D Deficiency, Fractures)   LONG TERM MONITOR (3-14 DAYS)   EKG 12-Lead   ECHOCARDIOGRAM COMPLETE   Medication Changes: No orders of the defined types were placed in this encounter.

## 2024-01-20 NOTE — Progress Notes (Unsigned)
 Enrolled for [employer] to mail a ZIO XT long term holter monitor to the patients address on file.

## 2024-01-20 NOTE — Patient Instructions (Signed)
 Medication Instructions:  Your physician recommends that you continue on your current medications as directed. Please refer to the Current Medication list given to you today.  *If you need a refill on your cardiac medications before your next appointment, please call your pharmacy*  Lab Work: Vit D If you have labs (blood work) drawn today and your tests are completely normal, you will receive your results only by: MyChart Message (if you have MyChart) OR A paper copy in the mail If you have any lab test that is abnormal or we need to change your treatment, we will call you to review the results.  Testing/Procedures: Your physician has requested that you have an echocardiogram. Echocardiography is a painless test that uses sound waves to create images of your heart. It provides your doctor with information about the size and shape of your heart and how well your heart's chambers and valves are working. This procedure takes approximately one hour. There are no restrictions for this procedure. Please do NOT wear cologne, perfume, aftershave, or lotions (deodorant is allowed). Please arrive 15 minutes prior to your appointment time.  Please note: We ask at that you not bring children with you during ultrasound (echo/ vascular) testing. Due to room size and safety concerns, children are not allowed in the ultrasound rooms during exams. Our front office staff cannot provide observation of children in our lobby area while testing is being conducted. An adult accompanying a patient to their appointment will only be allowed in the ultrasound room at the discretion of the ultrasound technician under special circumstances. We apologize for any inconvenience.  ZIO XT- Long Term Monitor Instructions  Your physician has requested you wear a ZIO patch monitor for 14 days.  This is a single patch monitor. [employer] supplies one patch monitor per enrollment. Additional stickers are not available. Please do not  apply patch if you will be having a Nuclear Stress Test,  Echocardiogram, Cardiac CT, MRI, or Chest Xray during the period you would be wearing the  monitor. The patch cannot be worn during these tests. You cannot remove and re-apply the  ZIO XT patch monitor.  Your ZIO patch monitor will be mailed 3 day USPS to your address on file. It may take 3-5 days  to receive your monitor after you have been enrolled.  Once you have received your monitor, please review the enclosed instructions. Your monitor  has already been registered assigning a specific monitor serial # to you.  Billing and Patient Assistance Program Information  We have supplied [employer] with any of your insurance information on file for billing purposes. [employer] offers a sliding scale Patient Assistance Program for patients that do not have  insurance, or whose insurance does not completely cover the cost of the ZIO monitor.  You must apply for the Patient Assistance Program to qualify for this discounted rate.  To apply, please call [employer] at [phone], select option 4, select option 2, ask to apply for  Patient Assistance Program. Meredeth will ask your household income, and how many people  are in your household. They will quote your out-of-pocket cost based on that information.  [employer] will also be able to set up a 75-month, interest-free payment plan if needed.  Applying the monitor   Shave hair from upper left chest.  Hold abrader disc by orange tab. Rub abrader in 40 strokes over the upper left chest as  indicated in your monitor instructions.  Clean area with 4 enclosed alcohol pads. Let  dry.  Apply patch as indicated in monitor instructions. Patch will be placed under collarbone on left  side of chest with arrow pointing upward.  Rub patch adhesive wings for 2 minutes. Remove white label marked 1. Remove the white  label marked 2. Rub patch adhesive wings for 2 additional minutes.  While looking in a mirror,  press and release button in center of patch. A small green light will  flash 3-4 times. This will be your only indicator that the monitor has been turned on.  Do not shower for the first 24 hours. You may shower after the first 24 hours.  Press the button if you feel a symptom. You will hear a small click. Record Date, Time and  Symptom in the Patient Logbook.  When you are ready to remove the patch, follow instructions on the last 2 pages of Patient  Logbook. Stick patch monitor onto the last page of Patient Logbook.  Place Patient Logbook in the blue and white box. Use locking tab on box and tape box closed  securely. The blue and white box has prepaid postage on it. Please place it in the mailbox as  soon as possible. Your physician should have your test results approximately 7 days after the  monitor has been mailed back to [facility].  Call [facility] Customer Care at [phone] if you have questions regarding  your ZIO XT patch monitor. Call them immediately if you see an orange light blinking on your  monitor.  If your monitor falls off in less than 4 days, contact our Monitor department at [phone].  If your monitor becomes loose or falls off after 4 days call [employer] at [phone] for  suggestions on securing your monitor   Follow-Up: At [facility], you and your health needs are our priority.  As part of our continuing mission to provide you with exceptional heart care, our providers are all part of one team.  This team includes your primary Cardiologist (physician) and Advanced Practice Providers or APPs (Physician Assistants and Nurse Practitioners) who all work together to provide you with the care you need, when you need it.  Your next appointment:   16 week(s) via MyChart  Provider:   Kardie Tobb, DO     Other Instructions   Here are some foods and drinks that can help you replenish your electrolyte stores. Drink unsweetened coconut water.  Coconut water is a good source of electrolytes. ... Eat bananas. ... Consume dairy products. ... Cook white meat and poultry. ... Eat avocado. ... Drink fruit juice. ... Snack on watermelon. ... Try electrolyte infused waters.

## 2024-01-21 ENCOUNTER — Ambulatory Visit: Admitting: Family Medicine

## 2024-01-23 ENCOUNTER — Ambulatory Visit
Admission: EM | Admit: 2024-01-23 | Discharge: 2024-01-23 | Disposition: A | Discharge status: Home / Self Care | Attending: Physician Assistant | Admitting: Physician Assistant

## 2024-01-23 ENCOUNTER — Other Ambulatory Visit: Payer: Self-pay

## 2024-01-23 DIAGNOSIS — N61 Mastitis without abscess: Secondary | ICD-10-CM | POA: Diagnosis not present

## 2024-01-23 MED ORDER — CEPHALEXIN 500 MG PO CAPS: 500.0000 mg | ORAL_CAPSULE | Freq: Four times a day (QID) | ORAL | 0 refills | Status: AC

## 2024-01-23 NOTE — ED Triage Notes (Addendum)
 Pt presents with a chief complaint of left breast pain x 3 days. States she does breastfeed her 81 month old baby. Having pain in breast with any type of bodily movement. At rest, being perfectly still, there is no pain. Pt reports the breast feels full. There is some relief when baby latches on and eats. Decrease in milk production of left breast. Rates pain a 6/10. Currently moving, eating muffin in triage. OTC Ibuprofen  taken PTA with no relief/improvement.   Pt felt warm and achy on her way over. Unsure of a fever before taking the Ibuprofen .

## 2024-01-23 NOTE — Discharge Instructions (Addendum)
 VISIT SUMMARY:  You came in today because of pain and swelling in your left breast that has been ongoing for the past few days. The pain has recently become more severe, especially with movement, and is significant enough to wake you up at night. You also feel generally unwell and achy, with the pain worsening when you move your arm or walk. Feeding your baby provides some relief, but the pain persists, and you have had difficulty pumping milk from the affected breast. There is some swelling and warmth in the breast, particularly when it is full, and your nipple has been painful during feeding.  YOUR PLAN:  -MASTITIS, LEFT BREAST: Mastitis is an infection of the breast tissue that results in pain, swelling, and warmth. It is often caused by bacteria entering the breast tissue, usually through a cracked or sore nipple. For your mastitis, I have prescribed Keflex  500 mg to be taken by mouth every 6 hours for 10 days to treat the infection. You should continue breastfeeding and try to express milk from the affected breast to help relieve the symptoms. For pain and inflammation, you can alternate between acetaminophen  and ibuprofen .  INSTRUCTIONS:  Please follow up if your symptoms do not improve after completing the antibiotic course or if they worsen. Continue to monitor the affected breast for any changes, and contact us  if you notice increased redness, swelling, or if you develop a fever.

## 2024-01-23 NOTE — ED Provider Notes (Signed)
 [facility]    CSN: [id number] Arrival date & time: 01/23/24  1202      History   Chief Complaint Chief Complaint  Patient presents with   Breast Pain    HPI Jodi Mckenzie is a 23 y.o. female.   HPI  Discussed the use of AI scribe software for clinical note transcription with the patient, who gave verbal consent to proceed.  The patient presents with left breast pain and swelling along the upper outer quadrant.  She has been experiencing pain in the left breast for the past 4 days, primarily in the upper area, which has recently become more severe, especially with movement. The pain is significant enough to wake her up at night when the breast becomes full.  Feeding her baby provides some relief, although the pain persists. She has attempted to pump milk but has been unable to extract much from the affected breast, which is unusual for her. She describes feeling generally unwell and achy today, with the pain exacerbated by arm movement and walking.  There is no redness, but she observes some swelling and warmth in the breast, particularly when it is full. Her nipple has been painful during feeding, with some redness around it. She has taken ibuprofen  for pain relief this AM but has not used any other medications. She is not allergic to anything other than latex.   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   Past Surgical History:  Procedure Laterality Date   NO PAST SURGERIES      OB History     Gravida  2   Para  2   Term  2   Preterm      AB      Living  2      SAB      IAB      Ectopic      Multiple  0   Live Births  2            Home Medications    Prior to Admission medications   Medication Sig Start Date End Date Taking? Authorizing Provider  cephALEXin  (KEFLEX ) 500 MG  capsule Take 1 capsule (500 mg total) by mouth 4 (four) times daily for 10 days. 01/23/24 02/02/24 Yes Leianne Callins E, PA-C  etonogestrel  (NEXPLANON ) 68 MG IMPL implant Inject 1 each into the skin once.    [provider]  prenatal vitamin w/FE, FA (PRENATAL 1 + 1) 27-1 MG TABS tablet Take 1 tablet by mouth daily at 12 noon. Patient not taking: Reported on 01/20/2024    [provider]    Family History Family History  Problem Relation Age of Onset   Thyroid  disease Mother    Diabetes Father     Social History Social History   Tobacco Use   Smoking status: Never   Smokeless tobacco: Never  Vaping Use   Vaping status: Never Used  Substance Use Topics   Alcohol use: Never   Drug use: Never     Allergies   Latex   Review of Systems Review of Systems  Constitutional:  Positive for fatigue.  Musculoskeletal:  Positive for myalgias.  Skin:        Left breast pain and tenderness      Physical Exam Triage Vital Signs ED Triage Vitals  Encounter Vitals  Group     BP 01/23/24 1210 129/84     Girls Systolic BP Percentile --      Girls Diastolic BP Percentile --      Boys Systolic BP Percentile --      Boys Diastolic BP Percentile --      Pulse Rate 01/23/24 1210 95     Resp 01/23/24 1210 16     Temp 01/23/24 1210 98.7 F (37.1 C)     Temp Source 01/23/24 1210 Oral     SpO2 01/23/24 1210 97 %     Weight 01/23/24 1210 153 lb (69.4 kg)     Height 01/23/24 1210 5' 5 (1.651 m)     Head Circumference --      Peak Flow --      Pain Score 01/23/24 1220 6     Pain Loc --      Pain Education --      Exclude from Growth Chart --    No data found.  Updated Vital Signs BP 129/84 (BP Location: Right Arm)   Pulse 95   Temp 98.7 F (37.1 C) (Oral)   Resp 16   Ht 5' 5 (1.651 m)   Wt 153 lb (69.4 kg)   SpO2 97%   Breastfeeding Yes   BMI 25.46 kg/m   Visual Acuity Right Eye Distance:   Left Eye Distance:   Bilateral Distance:    Right Eye Near:    Left Eye Near:    Bilateral Near:     Physical Exam Vitals reviewed.  Constitutional:      General: She is awake.     Appearance: Normal appearance. She is well-developed and well-groomed.  HENT:     Head: Normocephalic and atraumatic.  Eyes:     General: Lids are normal. Gaze aligned appropriately.     Extraocular Movements: Extraocular movements intact.     Conjunctiva/sclera: Conjunctivae normal.  Pulmonary:     Effort: Pulmonary effort is normal.  Chest:  Breasts:    Tanner Score is 5.     Breasts are asymmetrical.     Left: Swelling and tenderness present. No bleeding, inverted nipple, mass, nipple discharge or skin change.     Comments: Left breast is notably larger than the right. There is tenderness to the upper outer quadrant and mild redness of the left nipple and areola. Skin is warm to the touch over the left breast. No evidence of discrete mass, drainage, erythema or skin changes.  Neurological:     Mental Status: She is alert and oriented to person, place, and time.  Psychiatric:        Attention and Perception: Attention and perception normal.        Mood and Affect: Mood and affect normal.        Speech: Speech normal.        Behavior: Behavior normal. Behavior is cooperative.      UC Treatments / Results  Labs (all labs ordered are listed, but only abnormal results are displayed) Labs Reviewed - No data to display  EKG   Radiology No results found.  Procedures Procedures (including critical care time)  Medications Ordered in UC Medications - No data to display  Initial Impression / Assessment and Plan / UC Course  I have reviewed the triage vital signs and the nursing notes.  Pertinent labs & imaging results that were available during my care of the patient were reviewed by me and considered in my medical decision  making (see chart for details).      Final Clinical Impressions(s) / UC Diagnoses   Final diagnoses:  Mastitis, left, acute    Mastitis, left breast Mastitis in the left breast, characterized by pain, swelling, and warmth, particularly in the upper outer quadrant. Symptoms have persisted for approximately 4 days, with increased severity recently, including systemic symptoms such as malaise and myalgia. No erythema observed, but swelling and warmth are present. Pain is exacerbated by movement and somewhat relieved by breastfeeding. Difficulty in milk expression noted, with decreased milk output from the affected breast. Clinical presentation is consistent with mastitis. - Prescribe Keflex  for 10 days. - Encourage continued breastfeeding and milk expression from the affected breast. - Advise alternating acetaminophen  and ibuprofen  for pain and inflammation control.    Discharge Instructions      VISIT SUMMARY:  You came in today because of pain and swelling in your left breast that has been ongoing for the past few days. The pain has recently become more severe, especially with movement, and is significant enough to wake you up at night. You also feel generally unwell and achy, with the pain worsening when you move your arm or walk. Feeding your baby provides some relief, but the pain persists, and you have had difficulty pumping milk from the affected breast. There is some swelling and warmth in the breast, particularly when it is full, and your nipple has been painful during feeding.  YOUR PLAN:  -MASTITIS, LEFT BREAST: Mastitis is an infection of the breast tissue that results in pain, swelling, and warmth. It is often caused by bacteria entering the breast tissue, usually through a cracked or sore nipple. For your mastitis, I have prescribed Keflex  500 mg to be taken by mouth every 6 hours for 10 days to treat the infection. You should continue breastfeeding and try to express milk from the affected breast to help relieve the symptoms. For pain and inflammation, you can alternate between acetaminophen  and  ibuprofen .  INSTRUCTIONS:  Please follow up if your symptoms do not improve after completing the antibiotic course or if they worsen. Continue to monitor the affected breast for any changes, and contact us  if you notice increased redness, swelling, or if you develop a fever.     ED Prescriptions     Medication Sig Dispense Auth. Provider   cephALEXin  (KEFLEX ) 500 MG capsule Take 1 capsule (500 mg total) by mouth 4 (four) times daily for 10 days. 40 capsule Eulalah Rupert E, PA-C      PDMP not reviewed this encounter.   Duante Arocho, Rocky BRAVO, PA-C 01/23/24 1249

## 2024-02-22 DIAGNOSIS — R002 Palpitations: Secondary | ICD-10-CM

## 2024-02-22 DIAGNOSIS — R000 Tachycardia, unspecified: Secondary | ICD-10-CM

## 2024-02-28 ENCOUNTER — Ambulatory Visit (HOSPITAL_COMMUNITY)
Admission: RE | Admit: 2024-02-28 | Discharge: 2024-02-28 | Disposition: A | Discharge status: Home / Self Care | Source: Ambulatory Visit | Attending: Cardiology | Admitting: Cardiology

## 2024-02-28 DIAGNOSIS — R0600 Dyspnea, unspecified: Secondary | ICD-10-CM | POA: Diagnosis present

## 2024-02-28 LAB — ECHOCARDIOGRAM COMPLETE
Area-P 1/2: 4.63 cm2
S' Lateral: 2.79 cm

## 2024-02-29 ENCOUNTER — Ambulatory Visit: Payer: Self-pay | Admitting: Cardiology

## 2024-03-07 ENCOUNTER — Ambulatory Visit: Admitting: Obstetrics and Gynecology

## 2024-03-07 ENCOUNTER — Encounter: Payer: Self-pay | Admitting: Obstetrics and Gynecology

## 2024-03-07 VITALS — BP 130/84 | HR 96 | Ht 65.0 in | Wt 156.2 lb

## 2024-03-07 DIAGNOSIS — Z3009 Encounter for other general counseling and advice on contraception: Secondary | ICD-10-CM

## 2024-03-07 DIAGNOSIS — Z3046 Encounter for surveillance of implantable subdermal contraceptive: Secondary | ICD-10-CM | POA: Diagnosis not present

## 2024-03-07 MED ORDER — ETONOGESTREL-ETHINYL ESTRADIOL 0.12-0.015 MG/24HR VA RING: VAGINAL_RING | VAGINAL | 12 refills | Status: AC

## 2024-03-07 NOTE — Progress Notes (Signed)
   GYNECOLOGY OFFICE NOTE  History:  23 y.o. [id number] here today for removal of nexplanon . She had it placed in January and had no issues. Then last three months has been bleeding like  a period. Also been feeling anxious since it was placed and feels this is due to nexplanon . Would like it removed.    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 (Patient not taking: Reported on 01/20/2024), Disp: , Rfl:   The following portions of the patient's history were reviewed and updated as appropriate: allergies, current medications, past family history, past medical history, past social history, past surgical history and problem list.   Review of Systems:  Pertinent items noted in HPI and remainder of comprehensive ROS otherwise negative.   Objective:  Physical Exam BP 130/84   Pulse 96   Ht 5' 5 (1.651 m)   Wt 156 lb 3.2 oz (70.9 kg)   LMP  (LMP Unknown)   BMI 25.99 kg/m  CONSTITUTIONAL: Well-developed, well-nourished female in no acute distress.  HENT:  Normocephalic, atraumatic. External right and left ear normal. Oropharynx is clear and moist EYES: Conjunctivae and EOM are normal. Pupils are equal, round, and reactive to light. No scleral icterus.  NECK: Normal range of motion, supple, no masses SKIN: Skin is warm and dry. No rash noted. Not diaphoretic. No erythema. No pallor. NEUROLOGIC: Alert and oriented to person, place, and time. Normal reflexes, muscle tone coordination. No cranial nerve deficit  noted. PSYCHIATRIC: Normal mood and affect. Normal behavior. Normal judgment and thought content. CARDIOVASCULAR: Normal heart rate noted RESPIRATORY: Effort  normal, no problems with respiration noted ABDOMEN: Soft, no distention noted.   PELVIC: deferred MUSCULOSKELETAL: Normal range of motion. No edema noted.  Exam done with chaperone present.  Labs and Imaging   Assessment & Plan:  1. Nexplanon  removal (Primary) See note Pt verblizes understanding that Nuvaring is slightly less effective at preventing pregnancy than nexplanon   2. Encounter for counseling regarding contraception Reviewed options for contraception, she has been on nuvaring in the past and would like to restart - reviewed risks/benefits of nuvaring, she verbalizes understanding - Rx send to pharmacy   Routine preventative health maintenance measures emphasized. Please refer to After Visit Summary for other counseling recommendations.   Return in about 1 year (around 03/07/2025) for prn.   K. Yolanda Moats, MD, [facility] Attending Center for [facility])

## 2024-03-07 NOTE — Progress Notes (Signed)
     GYNECOLOGY OFFICE PROCEDURE NOTE  Jodi Mckenzie is a 23 y.o. [phone] here for Nexplanon  removal.  No other gynecologic concerns.  Reviewed risks of removal of implant including risk of infection, bleeding, damage to surrounding tissues and organs, migration of implant, difficult removal or inability to remove in office. She verbalizes understanding and affirms desire to proceed. Consent signed.     Nexplanon  Removal Patient identified, informed consent performed, consent signed. An adequate timeout was performed. Nexplanon  site identified.  Area prepped in usual sterile fashon. 3 mLs of 1% lidocaine  was used to anesthetize the area at the distal end of the implant. A small stab incision was made right beside the implant on the distal portion.  The Nexplanon  rod was grasped using hemostats and removed without difficulty intact.  There was minimal blood loss. There were no complications.  Steri-strips were applied over the small incision.  A pressure bandage was applied to reduce any bruising.  The patient tolerated the procedure well and was given post procedure instructions.  Patient is planning to use Nuvaring for contraception/attempt conception.    LOIS Yolanda Moats, MD, [facility] Attending Center for [facility])

## 2024-03-07 NOTE — Progress Notes (Signed)
 Pt presents for nexplanon  removal. Pt states that she has been bleeding for the last three months. Pt would like the NuvaRing for bc. Pt has no questions or concerns at this time.

## 2024-04-12 ENCOUNTER — Encounter: Payer: Self-pay | Admitting: Emergency Medicine

## 2024-04-12 ENCOUNTER — Ambulatory Visit
Admission: EM | Admit: 2024-04-12 | Discharge: 2024-04-12 | Disposition: A | Discharge status: Home / Self Care | Attending: Internal Medicine | Admitting: Internal Medicine

## 2024-04-12 DIAGNOSIS — R350 Frequency of micturition: Secondary | ICD-10-CM | POA: Diagnosis present

## 2024-04-12 DIAGNOSIS — N898 Other specified noninflammatory disorders of vagina: Secondary | ICD-10-CM | POA: Diagnosis not present

## 2024-04-12 LAB — POCT URINE PREGNANCY: Preg Test, Ur: NEGATIVE

## 2024-04-12 LAB — POCT URINE DIPSTICK
Bilirubin, UA: NEGATIVE
Glucose, UA: NEGATIVE mg/dL
Ketones, POC UA: NEGATIVE mg/dL
Nitrite, UA: NEGATIVE
Protein Ur, POC: NEGATIVE mg/dL
Spec Grav, UA: 1.01 (ref 1.010–1.025)
Urobilinogen, UA: 0.2 U/dL
pH, UA: 7 (ref 5.0–8.0)

## 2024-04-12 MED ORDER — FLUCONAZOLE 150 MG PO TABS: 150.0000 mg | ORAL_TABLET | Freq: Every day | ORAL | 0 refills | Status: AC

## 2024-04-12 NOTE — Discharge Instructions (Addendum)
 Urinalysis done today is not overly consistent with a urinary tract infection therefore we will send this off for urine culture to verify if there is actually bacteria present.  This will take approximately 2 to 3 days.  If there is evidence of an infection we will contact you to start antibiotics.  Symptoms and physical exam findings are most consistent with a vaginal yeast infection.  We have done a vaginal swab today and that will take approximately 24 to 48 hours to get final results but we will empirically treat for a vaginal yeast infection now. We will contact you if we need to arrange additional treatment based on your testing. Negative results will be on your MyChart account.  We will treat the yeast infection with the following: Diflucan  150 mg take 1 tablet now and then repeat in 3 days.  Make sure to stay hydrated by drinking plenty of water. Return to urgent care or PCP if symptoms worsen or fail to resolve.

## 2024-04-12 NOTE — ED Provider Notes (Addendum)
 [facility]    CSN: [id number] Arrival date & time: 04/12/24  1155      History   Chief Complaint Chief Complaint  Patient presents with   Vaginitis    HPI Jodi Mckenzie is a 23 y.o. female.   23 year old female who presents urgent care with complaints of urinary frequency, vaginal discharge and vaginal itching.  She has also noted some unusual vaginal bleeding when she wipes.  Her symptoms started about a week ago but have gotten worse in the last 2 days.  She relates that the vaginal discharge is very thick, Eragon Hammond and clumpy.  She is having a lot of itching associated with this.  She has noted that she is having a lot more urinary frequency but denies any dysuria or hematuria.  She denies any recent antibiotic use.  She denies any concerns for STI.  The only other associated symptoms she has had has been some mild left lower back pain.  She denies any injury to her back.  She denies any bowel or bladder incontinence.  She does have some ongoing constipation that has been unchanged.     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   Past Surgical History:  Procedure Laterality Date   NO PAST SURGERIES      OB History     Gravida  2   Para  2   Term  2   Preterm      AB      Living  2      SAB      IAB      Ectopic      Multiple  0   Live Births  2            Home Medications    Prior to Admission medications   Medication Sig Start Date End Date Taking? Authorizing Provider  fluconazole  (DIFLUCAN ) 150 MG tablet Take 1 tablet (150 mg total) by mouth daily for 2 days. take 1 tablet now and then repeat in 3 days 04/12/24 04/14/24 Yes Geovanny Sartin, Almarie LABOR, PA-C  etonogestrel -ethinyl estradiol  (NUVARING) 0.12-0.015 MG/24HR vaginal ring Insert vaginally and leave in place for 3 consecutive  weeks, then remove for 1 week. 03/07/24   Nicholaus Burnard HERO, MD  prenatal vitamin w/FE, FA (PRENATAL 1 + 1) 27-1 MG TABS tablet Take 1 tablet by mouth daily at 12 noon. Patient not taking: Reported on 01/20/2024    [provider]    Family History Family History  Problem Relation Age of Onset   Thyroid  disease Mother    Diabetes Father     Social History Social History   Tobacco Use   Smoking status: Never   Smokeless tobacco: Never  Vaping Use   Vaping status: Never Used  Substance Use Topics   Alcohol use: Never   Drug use: Never     Allergies   Latex   Review of Systems Review of Systems  Constitutional:  Negative for chills and fever.  HENT:  Negative for ear pain and sore throat.   Eyes:  Negative for pain and visual disturbance.  Respiratory:  Negative for cough and shortness of breath.   Cardiovascular:  Negative for chest pain and palpitations.  Gastrointestinal:  Positive for constipation (Ongoing). Negative for abdominal pain and vomiting.  Genitourinary:  Positive for frequency,  vaginal bleeding and vaginal discharge. Negative for dysuria and hematuria.       Vaginal itching  Musculoskeletal:  Positive for back pain (Mild left lower). Negative for arthralgias.  Skin:  Negative for color change and rash.  Neurological:  Negative for seizures and syncope.  All other systems reviewed and are negative.    Physical Exam Triage Vital Signs ED Triage Vitals  Encounter Vitals Group     BP 04/12/24 1207 134/83     Girls Systolic BP Percentile --      Girls Diastolic BP Percentile --      Boys Systolic BP Percentile --      Boys Diastolic BP Percentile --      Pulse Rate 04/12/24 1207 92     Resp 04/12/24 1207 17     Temp 04/12/24 1207 98 F (36.7 C)     Temp Source 04/12/24 1207 Oral     SpO2 04/12/24 1207 97 %     Weight --      Height --      Head Circumference --      Peak Flow --      Pain Score 04/12/24 1211 3     Pain Loc --      Pain  Education --      Exclude from Growth Chart --    No data found.  Updated Vital Signs BP 134/83 (BP Location: Right Arm)   Pulse 92   Temp 98 F (36.7 C) (Oral)   Resp 17   LMP 03/29/2024   SpO2 97%   Breastfeeding No   Visual Acuity Right Eye Distance:   Left Eye Distance:   Bilateral Distance:    Right Eye Near:   Left Eye Near:    Bilateral Near:     Physical Exam Vitals and nursing note reviewed.  Constitutional:      General: She is not in acute distress.    Appearance: She is well-developed.  HENT:     Head: Normocephalic and atraumatic.  Eyes:     Conjunctiva/sclera: Conjunctivae normal.  Cardiovascular:     Rate and Rhythm: Normal rate and regular rhythm.     Heart sounds: No murmur heard. Pulmonary:     Effort: Pulmonary effort is normal. No respiratory distress.     Breath sounds: Normal breath sounds.  Abdominal:     Palpations: Abdomen is soft.     Tenderness: There is no abdominal tenderness.  Musculoskeletal:        General: No swelling.     Cervical back: Neck supple.       Back:  Skin:    General: Skin is warm and dry.     Capillary Refill: Capillary refill takes less than 2 seconds.  Neurological:     Mental Status: She is alert.  Psychiatric:        Mood and Affect: Mood normal.      UC Treatments / Results  Labs (all labs ordered are listed, but only abnormal results are displayed) Labs Reviewed  POCT URINE DIPSTICK - Abnormal; Notable for the following components:      Result Value   Color, UA light yellow (*)    Blood, UA trace-intact (*)    Leukocytes, UA Small (1+) (*)    All other components within normal limits  POCT URINE PREGNANCY - Normal  CERVICOVAGINAL ANCILLARY ONLY    EKG   Radiology No results found.  Procedures Procedures (including critical care time)  Medications  Ordered in UC Medications - No data to display  Initial Impression / Assessment and Plan / UC Course  I have reviewed the triage vital  signs and the nursing notes.  Pertinent labs & imaging results that were available during my care of the patient were reviewed by me and considered in my medical decision making (see chart for details).     Urinary frequency - Plan: POCT URINE DIPSTICK, POCT URINE DIPSTICK  Vaginal discharge - Plan: POCT urine pregnancy, POCT urine pregnancy  Vaginal itching   Urinalysis done today is not overly consistent with a urinary tract infection therefore we will send this off for urine culture to verify if there is actually bacteria present.  This will take approximately 2 to 3 days.  If there is evidence of an infection we will contact you to start antibiotics.  Symptoms and physical exam findings are most consistent with a vaginal yeast infection.  We have done a vaginal swab today and that will take approximately 24 to 48 hours to get final results but we will empirically treat for a vaginal yeast infection now. We will contact you if we need to arrange additional treatment based on your testing. Negative results will be on your MyChart account.  We will treat the yeast infection with the following: Diflucan  150 mg take 1 tablet now and then repeat in 3 days.  Make sure to stay hydrated by drinking plenty of water. Return to urgent care or PCP if symptoms worsen or fail to resolve.    Final Clinical Impressions(s) / UC Diagnoses   Final diagnoses:  Urinary frequency  Vaginal discharge  Vaginal itching     Discharge Instructions      Urinalysis done today is not overly consistent with a urinary tract infection therefore we will send this off for urine culture to verify if there is actually bacteria present.  This will take approximately 2 to 3 days.  If there is evidence of an infection we will contact you to start antibiotics.  Symptoms and physical exam findings are most consistent with a vaginal yeast infection.  We have done a vaginal swab today and that will take approximately 24 to 48  hours to get final results but we will empirically treat for a vaginal yeast infection now. We will contact you if we need to arrange additional treatment based on your testing. Negative results will be on your MyChart account.  We will treat the yeast infection with the following: Diflucan  150 mg take 1 tablet now and then repeat in 3 days.  Make sure to stay hydrated by drinking plenty of water. Return to urgent care or PCP if symptoms worsen or fail to resolve.      ED Prescriptions     Medication Sig Dispense Auth. Provider   fluconazole  (DIFLUCAN ) 150 MG tablet Take 1 tablet (150 mg total) by mouth daily for 2 days. take 1 tablet now and then repeat in 3 days 2 tablet Teresa Almarie LABOR, PA-C      PDMP not reviewed this encounter.   Teresa Almarie LABOR, PA-C 04/12/24 1234    Teresa Almarie LABOR, PA-C 04/12/24 1234

## 2024-04-12 NOTE — ED Triage Notes (Signed)
 Pt c/o vaginal itching, burning, thick white/pinkish discharge, and urinating more frequently for about a week. Symptoms got worse 2days ago.

## 2024-04-14 ENCOUNTER — Telehealth: Payer: Self-pay

## 2024-04-14 LAB — CERVICOVAGINAL ANCILLARY ONLY
Bacterial Vaginitis (gardnerella): POSITIVE — AB
Candida Glabrata: NEGATIVE
Candida Vaginitis: POSITIVE — AB
Chlamydia: NEGATIVE
Comment: NEGATIVE
Comment: NEGATIVE
Comment: NEGATIVE
Comment: NEGATIVE
Comment: NEGATIVE
Comment: NORMAL
Neisseria Gonorrhea: NEGATIVE
Trichomonas: NEGATIVE

## 2024-04-14 MED ORDER — FLUCONAZOLE 150 MG PO TABS: 150.0000 mg | ORAL_TABLET | ORAL | 0 refills | Status: AC | PRN

## 2024-04-14 NOTE — Telephone Encounter (Signed)
 Diflucan sent per pt request.

## 2024-04-14 NOTE — Telephone Encounter (Signed)
 This RN returned patient's phone call at this time. Name and DOB verified. Pt called stating Diflucan  was called into her pharmacy from 11/26 visit. States there were two instructions listed and pharmacy was unable to fill it. Requesting a provider resend the prescription over to the pharmacy today. Chart routed to [facility] (she is aware).

## 2024-04-18 ENCOUNTER — Ambulatory Visit (HOSPITAL_COMMUNITY): Payer: Self-pay

## 2024-04-18 MED ORDER — METRONIDAZOLE 500 MG PO TABS: 500.0000 mg | ORAL_TABLET | Freq: Two times a day (BID) | ORAL | 0 refills | Status: AC

## 2024-05-08 ENCOUNTER — Telehealth: Payer: Self-pay

## 2024-05-08 ENCOUNTER — Ambulatory Visit: Attending: Cardiology | Admitting: Cardiology

## 2024-05-08 NOTE — Telephone Encounter (Signed)
 Called pt to get her ready for her appointment. Np answer. Left message for her to return the call to reschedule.

## 2024-05-08 NOTE — Telephone Encounter (Signed)
 Called patient 3 times for her video visit and the patient did not answer. Left voice mail to call back.
